# Patient Record
Sex: Male | Born: 1980 | Hispanic: No | Marital: Married | Smoking: Never smoker
Health system: Southern US, Community
[De-identification: ages and names within clinical notes are randomized; demographics above are authoritative.]

## PROBLEM LIST (undated history)

## (undated) DIAGNOSIS — A159 Respiratory tuberculosis unspecified: Secondary | ICD-10-CM

## (undated) DIAGNOSIS — H5440 Blindness, one eye, unspecified eye: Secondary | ICD-10-CM

## (undated) DIAGNOSIS — S0990XA Unspecified injury of head, initial encounter: Secondary | ICD-10-CM

## (undated) DIAGNOSIS — H409 Unspecified glaucoma: Secondary | ICD-10-CM

## (undated) HISTORY — DX: Unspecified glaucoma: H40.9

---

## 2013-09-24 ENCOUNTER — Ambulatory Visit
Admission: RE | Admit: 2013-09-24 | Discharge: 2013-09-24 | Disposition: A | Discharge status: Home / Self Care | Payer: No Typology Code available for payment source | Source: Ambulatory Visit | Attending: Infectious Disease | Admitting: Infectious Disease

## 2013-09-24 ENCOUNTER — Other Ambulatory Visit: Payer: Self-pay | Admitting: Infectious Disease

## 2013-09-24 DIAGNOSIS — A150 Tuberculosis of lung: Secondary | ICD-10-CM

## 2014-06-26 ENCOUNTER — Ambulatory Visit: Payer: Self-pay | Attending: Internal Medicine

## 2014-07-19 ENCOUNTER — Ambulatory Visit: Payer: Self-pay

## 2014-08-28 ENCOUNTER — Ambulatory Visit: Payer: Self-pay | Attending: Internal Medicine

## 2014-08-29 ENCOUNTER — Encounter (HOSPITAL_COMMUNITY): Payer: Self-pay

## 2014-08-29 ENCOUNTER — Emergency Department (INDEPENDENT_AMBULATORY_CARE_PROVIDER_SITE_OTHER)
Admission: EM | Admit: 2014-08-29 | Discharge: 2014-08-29 | Disposition: A | Discharge status: Home / Self Care | Payer: Self-pay | Source: Home / Self Care | Attending: Family Medicine | Admitting: Family Medicine

## 2014-08-29 DIAGNOSIS — R51 Headache: Secondary | ICD-10-CM

## 2014-08-29 DIAGNOSIS — Z87820 Personal history of traumatic brain injury: Secondary | ICD-10-CM

## 2014-08-29 DIAGNOSIS — R413 Other amnesia: Secondary | ICD-10-CM

## 2014-08-29 DIAGNOSIS — G8929 Other chronic pain: Secondary | ICD-10-CM

## 2014-08-29 HISTORY — DX: Respiratory tuberculosis unspecified: A15.9

## 2014-08-29 HISTORY — DX: Blindness, one eye, unspecified eye: H54.40

## 2014-08-29 HISTORY — DX: Unspecified injury of head, initial encounter: S09.90XA

## 2014-08-29 NOTE — Discharge Instructions (Signed)
Headaches, Frequently Asked Questions Will need referrals to neuro and opthalmologist for progressive chronic symptoms.    MIGRAINE HEADACHES Q: What is migraine? What causes it? How can I treat it? A: Generally, migraine headaches begin as a dull ache. Then they develop into a constant, throbbing, and pulsating pain. You may experience pain at the temples. You may experience pain at the front or back of one or both sides of the head. The pain is usually accompanied by a combination of:  Nausea.  Vomiting.  Sensitivity to light and noise. Some people (about 15%) experience an aura (see below) before an attack. The cause of migraine is believed to be chemical reactions in the brain. Treatment for migraine may include over-the-counter or prescription medications. It may also include self-help techniques. These include relaxation training and biofeedback.  Q: What is an aura? A: About 15% of people with migraine get an "aura". This is a sign of neurological symptoms that occur before a migraine headache. You may see wavy or jagged lines, dots, or flashing lights. You might experience tunnel vision or blind spots in one or both eyes. The aura can include visual or auditory hallucinations (something imagined). It may include disruptions in smell (such as strange odors), taste or touch. Other symptoms include:  Numbness.  A "pins and needles" sensation.  Difficulty in recalling or speaking the correct word. These neurological events may last as long as 60 minutes. These symptoms will fade as the headache begins. Q: What is a trigger? A: Certain physical or environmental factors can lead to or "trigger" a migraine. These include:  Foods.  Hormonal changes.  Weather.  Stress. It is important to remember that triggers are different for everyone. To help prevent migraine attacks, you need to figure out which triggers affect you. Keep a headache diary. This is a good way to track triggers. The  diary will help you talk to your healthcare professional about your condition. Q: Does weather affect migraines? A: Bright sunshine, hot, humid conditions, and drastic changes in barometric pressure may lead to, or "trigger," a migraine attack in some people. But studies have shown that weather does not act as a trigger for everyone with migraines. Q: What is the link between migraine and hormones? A: Hormones start and regulate many of your body's functions. Hormones keep your body in balance within a constantly changing environment. The levels of hormones in your body are unbalanced at times. Examples are during menstruation, pregnancy, or menopause. That can lead to a migraine attack. In fact, about three quarters of all women with migraine report that their attacks are related to the menstrual cycle.  Q: Is there an increased risk of stroke for migraine sufferers? A: The likelihood of a migraine attack causing a stroke is very remote. That is not to say that migraine sufferers cannot have a stroke associated with their migraines. In persons under age 55, the most common associated factor for stroke is migraine headache. But over the course of a person's normal life span, the occurrence of migraine headache may actually be associated with a reduced risk of dying from cerebrovascular disease due to stroke.  Q: What are acute medications for migraine? A: Acute medications are used to treat the pain of the headache after it has started. Examples over-the-counter medications, NSAIDs, ergots, and triptans.  Q: What are the triptans? A: Triptans are the newest class of abortive medications. They are specifically targeted to treat migraine. Triptans are vasoconstrictors. They moderate some chemical  reactions in the brain. The triptans work on receptors in your brain. Triptans help to restore the balance of a neurotransmitter called serotonin. Fluctuations in levels of serotonin are thought to be a main cause  of migraine.  Q: Are over-the-counter medications for migraine effective? A: Over-the-counter, or "OTC," medications may be effective in relieving mild to moderate pain and associated symptoms of migraine. But you should see your caregiver before beginning any treatment regimen for migraine.  Q: What are preventive medications for migraine? A: Preventive medications for migraine are sometimes referred to as "prophylactic" treatments. They are used to reduce the frequency, severity, and length of migraine attacks. Examples of preventive medications include antiepileptic medications, antidepressants, beta-blockers, calcium channel blockers, and NSAIDs (nonsteroidal anti-inflammatory drugs). Q: Why are anticonvulsants used to treat migraine? A: During the past few years, there has been an increased interest in antiepileptic drugs for the prevention of migraine. They are sometimes referred to as "anticonvulsants". Both epilepsy and migraine may be caused by similar reactions in the brain.  Q: Why are antidepressants used to treat migraine? A: Antidepressants are typically used to treat people with depression. They may reduce migraine frequency by regulating chemical levels, such as serotonin, in the brain.  Q: What alternative therapies are used to treat migraine? A: The term "alternative therapies" is often used to describe treatments considered outside the scope of conventional Western medicine. Examples of alternative therapy include acupuncture, acupressure, and yoga. Another common alternative treatment is herbal therapy. Some herbs are believed to relieve headache pain. Always discuss alternative therapies with your caregiver before proceeding. Some herbal products contain arsenic and other toxins. TENSION HEADACHES Q: What is a tension-type headache? What causes it? How can I treat it? A: Tension-type headaches occur randomly. They are often the result of temporary stress, anxiety, fatigue, or anger.  Symptoms include soreness in your temples, a tightening band-like sensation around your head (a "vice-like" ache). Symptoms can also include a pulling feeling, pressure sensations, and contracting head and neck muscles. The headache begins in your forehead, temples, or the back of your head and neck. Treatment for tension-type headache may include over-the-counter or prescription medications. Treatment may also include self-help techniques such as relaxation training and biofeedback. CLUSTER HEADACHES Q: What is a cluster headache? What causes it? How can I treat it? A: Cluster headache gets its name because the attacks come in groups. The pain arrives with little, if any, warning. It is usually on one side of the head. A tearing or bloodshot eye and a runny nose on the same side of the headache may also accompany the pain. Cluster headaches are believed to be caused by chemical reactions in the brain. They have been described as the most severe and intense of any headache type. Treatment for cluster headache includes prescription medication and oxygen. SINUS HEADACHES Q: What is a sinus headache? What causes it? How can I treat it? A: When a cavity in the bones of the face and skull (a sinus) becomes inflamed, the inflammation will cause localized pain. This condition is usually the result of an allergic reaction, a tumor, or an infection. If your headache is caused by a sinus blockage, such as an infection, you will probably have a fever. An x-ray will confirm a sinus blockage. Your caregiver's treatment might include antibiotics for the infection, as well as antihistamines or decongestants.  REBOUND HEADACHES Q: What is a rebound headache? What causes it? How can I treat it? A: A pattern of taking  acute headache medications too often can lead to a condition known as "rebound headache." A pattern of taking too much headache medication includes taking it more than 2 days per week or in excessive amounts.  That means more than the label or a caregiver advises. With rebound headaches, your medications not only stop relieving pain, they actually begin to cause headaches. Doctors treat rebound headache by tapering the medication that is being overused. Sometimes your caregiver will gradually substitute a different type of treatment or medication. Stopping may be a challenge. Regularly overusing a medication increases the potential for serious side effects. Consult a caregiver if you regularly use headache medications more than 2 days per week or more than the label advises. ADDITIONAL QUESTIONS AND ANSWERS Q: What is biofeedback? A: Biofeedback is a self-help treatment. Biofeedback uses special equipment to monitor your body's involuntary physical responses. Biofeedback monitors:  Breathing.  Pulse.  Heart rate.  Temperature.  Muscle tension.  Brain activity. Biofeedback helps you refine and perfect your relaxation exercises. You learn to control the physical responses that are related to stress. Once the technique has been mastered, you do not need the equipment any more. Q: Are headaches hereditary? A: Four out of five (80%) of people that suffer report a family history of migraine. Scientists are not sure if this is genetic or a family predisposition. Despite the uncertainty, a child has a 50% chance of having migraine if one parent suffers. The child has a 75% chance if both parents suffer.  Q: Can children get headaches? A: By the time they reach high school, most young people have experienced some type of headache. Many safe and effective approaches or medications can prevent a headache from occurring or stop it after it has begun.  Q: What type of doctor should I see to diagnose and treat my headache? A: Start with your primary caregiver. Discuss his or her experience and approach to headaches. Discuss methods of classification, diagnosis, and treatment. Your caregiver may decide to recommend  you to a headache specialist, depending upon your symptoms or other physical conditions. Having diabetes, allergies, etc., may require a more comprehensive and inclusive approach to your headache. The National Headache Foundation will provide, upon request, a list of [facility] physician members in your state. Document Released: 04/24/2003 Document Revised: 04/26/2011 Document Reviewed: 10/02/2007 [facility] Patient Information 2015 [place], [place]. This information is not intended to replace advice given to you by your health care provider. Make sure you discuss any questions you have with your health care provider.

## 2014-08-29 NOTE — ED Provider Notes (Signed)
CSN: [id number]     Arrival date & time 08/29/14  1433 History   First MD Initiated Contact with Patient 08/29/14 1551     Chief Complaint  Patient presents with  . Headache   (Consider location/radiation/quality/duration/timing/severity/associated sxs/prior Treatment) HPI Comments: 34 year old man from the [place] area is complaining of a right headache with increasing forgetfulness for one year. He states that he had a head injury when he was a small child and he also has a history of TB. A more recent chest x-ray last year showed no active processes. There are no acute symptoms today. He has spoken with Burman Nieves for assistance with orange card and follow-up with community wellness.   Past Medical History  Diagnosis Date  . Tuberculosis   . Head injury   . Blind left eye    History reviewed. No pertinent past surgical history. History reviewed. No pertinent family history. History  Substance Use Topics  . Smoking status: Never Smoker   . Smokeless tobacco: Not on file  . Alcohol Use: Not on file    Review of Systems  Constitutional: Positive for activity change. Negative for fever and fatigue.  Eyes: Positive for visual disturbance. Negative for pain and discharge.       Blind in the left eye. Chronic poor vision of the right eye with overlying opacity of the mid anterior chamber.  Respiratory: Negative.   Cardiovascular: Negative.   Gastrointestinal: Negative.   Skin: Negative for rash and wound.  Neurological: Positive for facial asymmetry and headaches. Negative for dizziness and syncope.  Psychiatric/Behavioral: Negative for behavioral problems.    Allergies  Review of patient's allergies indicates no known allergies.  Home Medications   Prior to Admission medications   Not on File   BP 108/80 mmHg  Pulse 78  Temp(Src) 98.4 F (36.9 C) (Oral)  SpO2 100% Physical Exam  Constitutional: He appears well-developed and well-nourished. No distress.  HENT:   Mouth/Throat: Oropharynx is clear and moist. No oropharyngeal exudate.  Eyes: Right eye exhibits no discharge. Left eye exhibits no discharge.  Right pupil is small and difficult to determine whether it is actually reacting. It is also partly hidden due to opacity over the anterior chamber. Complete blindness OS  Neck: Normal range of motion. Neck supple.  Cardiovascular: Normal rate, regular rhythm, normal heart sounds and intact distal pulses.   Pulmonary/Chest: Effort normal and breath sounds normal. No respiratory distress. He has no wheezes. He has no rales.  Abdominal: There is no tenderness.  Musculoskeletal: Normal range of motion. He exhibits no edema or tenderness.  Lymphadenopathy:    He has no cervical adenopathy.  Neurological: He is alert. He has normal strength. No cranial nerve deficit or sensory deficit. He exhibits normal muscle tone. He displays a negative Romberg sign. Coordination and gait normal.  Skin: Skin is warm and dry.  Psychiatric: He has a normal mood and affect. His behavior is normal.  Nursing note and vitals reviewed.   ED Course  Procedures (including critical care time) Labs Review Labs Reviewed - No data to display  Imaging Review No results found.   MDM   1. History of closed head injury   2. Chronic nonintractable headache, unspecified headache type   3. Memory impairment    Will need referrals to neuro and opthalmologist for progressive chronic symptoms.  Burman Nieves has started the [employer] and follow with [employer] for exam and referrals.  For worsening go to the ED No acute findings  today    Janne Napoleon, NP 08/29/14 [phone]

## 2014-08-29 NOTE — ED Notes (Signed)
C/o right sided HA x 1 year + . History of head injury as child. Reportedly on medication for TB , but has finished

## 2014-09-05 ENCOUNTER — Ambulatory Visit: Payer: Self-pay | Attending: Internal Medicine

## 2014-09-06 ENCOUNTER — Encounter: Payer: Self-pay | Admitting: Family Medicine

## 2014-09-06 ENCOUNTER — Ambulatory Visit: Payer: Self-pay | Attending: Family Medicine | Admitting: Family Medicine

## 2014-09-06 VITALS — BP 109/76 | HR 77 | Temp 98.3°F | Ht 68.0 in | Wt 177.0 lb

## 2014-09-06 DIAGNOSIS — R51 Headache: Secondary | ICD-10-CM

## 2014-09-06 DIAGNOSIS — R413 Other amnesia: Secondary | ICD-10-CM | POA: Insufficient documentation

## 2014-09-06 DIAGNOSIS — G44229 Chronic tension-type headache, not intractable: Secondary | ICD-10-CM | POA: Insufficient documentation

## 2014-09-06 DIAGNOSIS — H5440 Blindness, one eye, unspecified eye: Secondary | ICD-10-CM | POA: Insufficient documentation

## 2014-09-06 DIAGNOSIS — R519 Headache, unspecified: Secondary | ICD-10-CM | POA: Insufficient documentation

## 2014-09-06 DIAGNOSIS — S0990XA Unspecified injury of head, initial encounter: Secondary | ICD-10-CM | POA: Insufficient documentation

## 2014-09-06 DIAGNOSIS — H5442 Blindness, left eye, normal vision right eye: Secondary | ICD-10-CM | POA: Insufficient documentation

## 2014-09-06 HISTORY — DX: Blindness, one eye, unspecified eye: H54.40

## 2014-09-06 MED ORDER — BUTALBITAL-APAP-CAFFEINE 50-325-40 MG PO TABS: 1.0000 | ORAL_TABLET | Freq: Four times a day (QID) | ORAL | Status: DC | PRN

## 2014-09-06 NOTE — Progress Notes (Signed)
Patient ID: Adam Contreras, male   DOB: 01-14-1981, 34 y.o.   MRN: [id number]     Adam Contreras, is a 34 y.o. male  SAY:[id number]  ATF:[id number]  DOB - 1980/11/21  CC:  Chief Complaint  Patient presents with  . headaches       HPI: Adam Contreras is a 34 y.o. male from [place] here today to establish medical care. He is seen with the aid of an interpreter from language resources.  He complains of chronic headaches which are: Most days of the week in the frontal aspect of his head which he has had for the last 1 year and this has been associated with memory loss and deteriorating vision in his right eye. Headaches are throbbing. 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 States it feels like he has a film over his right eye which affects his vision. He endorses nausea but no vomiting and this is intermittent.  He was seen at [facility] urgent care for same and had a chest x-ray which showed no active disease. He has not had any brain imaging.       Marland KitchenNo Known Allergies Past Medical History  Diagnosis Date  . Tuberculosis   . Head injury   . Blind left eye    No current outpatient prescriptions on file prior to visit.   No current facility-administered medications on file prior to visit.   History reviewed. No pertinent family history. History   Social History  . Marital Status: Single    Spouse Name: N/A  . Number of Children: N/A  . Years of Education: N/A   Occupational History  . Not on file.   Social History Main Topics  . Smoking status: Never Smoker   . Smokeless tobacco: Not on file  . Alcohol Use: No  . Drug Use: No  . Sexual Activity: Not on file   Other Topics Concern  . Not on file   Social History  Narrative    Review of Systems: Constitutional: Negative for fever, chills, diaphoresis, activity change, appetite change and fatigue. HENT: Negative for ear pain, nosebleeds, congestion, facial swelling, rhinorrhea, neck pain, neck stiffness and ear discharge.  Eyes: Positive for left eye visual loss, deteriorating vision in the right eye. Respiratory: Negative for cough, choking, chest tightness, shortness of breath, wheezing and stridor.  Cardiovascular: Negative for chest pain, palpitations and leg swelling. Gastrointestinal: Negative for abdominal distention. Positive for nausea Genitourinary: Negative for dysuria, urgency, frequency, hematuria, flank pain, decreased urine volume, difficulty urinating and dyspareunia.  Musculoskeletal: Negative for back pain, joint swelling, arthralgia and gait problem. Neurological: Negative for dizziness, tremors, seizures, syncope, facial asymmetry, speech difficulty, weakness, light-headedness, numbness, positive for headaches. Hematological: Negative for adenopathy. Does not bruise/bleed easily. Skin: Negative for rash, ulcer. Psychiatric/Behavioral: Negative for hallucinations, behavioral problems, confusion, dysphoric mood, decreased concentration and agitation.    Objective:   Filed Vitals:   09/06/14 0919  BP: 109/76  Pulse: 77  Temp: 98.3 F (36.8 C)    Physical Exam: Constitutional: Patient appears well-developed and well-nourished. No distress. HENT: Normocephalic, atraumatic, External right and left ear normal. Oropharynx is clear and moist.  Eyes: Left prosthetic eye, visual acuity in right eye  is 20/200 Neck: Normal ROM, No JVD. No tracheal deviation. No thyromegaly. CVS: RRR, S1/S2 +, no murmurs, no gallops, no carotid bruit.  Pulmonary: Effort and breath sounds normal, no stridor, rhonchi, wheezes, rales.  Abdominal: Soft. BS +, no distension, tenderness, rebound or guarding.  Musculoskeletal: Normal range of motion. No edema  and no tenderness.  Lymphadenopathy: No lymphadenopathy noted, cervical, inguinal or axillary Neuro: Alert. Normal reflexes, muscle tone coordination. No cranial nerve deficit. Skin: Skin is warm and dry. No rash noted. Not diaphoretic. No erythema. No pallor. Psychiatric: Normal mood and affect. Behavior, judgment, thought content normal.     Assessment and plan:  34 year old male patient with a history of traumatic brain injury in the past, visual loss in the left eye presenting with chronic headaches, nausea, memory loss for one year.  Chronic headaches: Referred for CT scan of the head to exclude intracranial pathology. Placed on Fioricet to be used as needed.  Memory loss: This could be progressive from his traumatic brain injury. I am sending off a thyroid function, B12 and VDRL to further evaluate. He will need a neurology referral at his next visit.  Visual loss: Vision in the right eye is 20/200. Advised against driving. I am getting in touch with the referral coordinator to see what resources are available for ophthalmology given he has no insurance meanwhile I have advised him to walk into optometry services available at [facility], [facility], [facility] for preliminary evaluation.   The patient was given clear instructions to go to ER or return to medical center if symptoms don't improve, worsen or new problems develop. The patient verbalized understanding. The patient was told to call to get lab results if they haven't heard anything in the next week.     Arnoldo Morale, [place] [facility] [phone] 09/06/2014, 9:41 AM

## 2014-09-06 NOTE — Patient Instructions (Signed)

## 2014-09-06 NOTE — Progress Notes (Signed)
TBI as child Headaches for last year in frontal region Takes no medication and is in no pain today although reports headaches daily Orange card in Audiological scientist present

## 2014-09-07 LAB — TSH: TSH: 2.643 u[IU]/mL (ref 0.350–4.500)

## 2014-09-07 LAB — VITAMIN B12: Vitamin B-12: 510 pg/mL (ref 211–911)

## 2014-09-08 LAB — VDRL: VDRL, Serum: NONREACTIVE

## 2014-09-10 ENCOUNTER — Ambulatory Visit (HOSPITAL_COMMUNITY)
Admission: RE | Admit: 2014-09-10 | Discharge: 2014-09-10 | Disposition: A | Discharge status: Home / Self Care | Payer: Self-pay | Source: Ambulatory Visit | Attending: Family Medicine | Admitting: Family Medicine

## 2014-09-10 DIAGNOSIS — G930 Cerebral cysts: Secondary | ICD-10-CM | POA: Insufficient documentation

## 2014-09-10 DIAGNOSIS — S0990XA Unspecified injury of head, initial encounter: Secondary | ICD-10-CM

## 2014-09-10 DIAGNOSIS — D170 Benign lipomatous neoplasm of skin and subcutaneous tissue of head, face and neck: Secondary | ICD-10-CM | POA: Insufficient documentation

## 2014-09-10 DIAGNOSIS — Z87820 Personal history of traumatic brain injury: Secondary | ICD-10-CM | POA: Insufficient documentation

## 2014-09-10 DIAGNOSIS — G44229 Chronic tension-type headache, not intractable: Secondary | ICD-10-CM | POA: Insufficient documentation

## 2014-09-16 ENCOUNTER — Telehealth: Payer: Self-pay

## 2014-09-16 NOTE — Telephone Encounter (Signed)
Nurse called patient, via [employer], Interpreter [phone],  patient verified date of birth. Patient aware of normal labs. Patient voices understanding and has no further questions at this time.

## 2014-09-16 NOTE — Telephone Encounter (Signed)
-----   Message from Arnoldo Morale, MD sent at 09/09/2014 10:10 AM EDT ----- Please inform the patient that labs are normal. Thank you.

## 2014-09-17 ENCOUNTER — Other Ambulatory Visit: Payer: Self-pay | Admitting: Family Medicine

## 2014-09-17 DIAGNOSIS — R413 Other amnesia: Secondary | ICD-10-CM

## 2014-09-17 DIAGNOSIS — G44229 Chronic tension-type headache, not intractable: Secondary | ICD-10-CM

## 2014-09-17 NOTE — Telephone Encounter (Signed)
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

## 2014-09-17 NOTE — Telephone Encounter (Signed)
[facility] service does not have a [place] interpreter available at this time.  I asked when I should call back and he said later today.Marland KitchenMarland Kitchen

## 2014-09-20 ENCOUNTER — Encounter: Payer: Self-pay | Admitting: Family Medicine

## 2014-09-20 ENCOUNTER — Ambulatory Visit: Payer: Self-pay | Attending: Family Medicine | Admitting: Family Medicine

## 2014-09-20 VITALS — BP 107/73 | HR 68 | Temp 98.2°F | Ht 69.0 in | Wt 179.8 lb

## 2014-09-20 DIAGNOSIS — R930 Abnormal findings on diagnostic imaging of skull and head, not elsewhere classified: Secondary | ICD-10-CM

## 2014-09-20 DIAGNOSIS — R51 Headache: Secondary | ICD-10-CM | POA: Insufficient documentation

## 2014-09-20 DIAGNOSIS — H543 Unqualified visual loss, both eyes: Secondary | ICD-10-CM | POA: Insufficient documentation

## 2014-09-20 DIAGNOSIS — Z87820 Personal history of traumatic brain injury: Secondary | ICD-10-CM | POA: Insufficient documentation

## 2014-09-20 DIAGNOSIS — H5442 Blindness, left eye, normal vision right eye: Secondary | ICD-10-CM

## 2014-09-20 DIAGNOSIS — H5440 Blindness, one eye, unspecified eye: Secondary | ICD-10-CM

## 2014-09-20 DIAGNOSIS — G44229 Chronic tension-type headache, not intractable: Secondary | ICD-10-CM

## 2014-09-20 DIAGNOSIS — R413 Other amnesia: Secondary | ICD-10-CM | POA: Insufficient documentation

## 2014-09-20 NOTE — Progress Notes (Signed)
Subjective:    Patient ID: Adam Contreras, male    DOB: [birth year], 34 y.o.   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 He is headaches are intermittent and occur about 2-3 times a week with no nausea, no vomiting, no seizure-like activity.  He is seen with the aid of an interpreter from language [facility] today.  Past Medical History  Diagnosis Date  . Tuberculosis   . Head injury   . Blind left eye     History reviewed. No pertinent past surgical history.  History   Social History  . Marital Status: Single    Spouse Name: N/A  . Number of Children: N/A  . Years of Education: N/A   Occupational History  . Not on file.   Social History Main Topics  . Smoking status: Never Smoker   . Smokeless tobacco: Not on file  . Alcohol Use: No  . Drug Use: No  . Sexual Activity: Not on file   Other Topics Concern  . Not on file   Social History Narrative   Scheduled Meds: Continuous Infusions: PRN Meds:.    Review of Systems Constitutional: Negative for fever, chills, diaphoresis, activity change, appetite change and fatigue. HENT: Negative for ear pain, nosebleeds, congestion, facial swelling, rhinorrhea, neck pain, neck stiffness and ear discharge.  Eyes: Positive for left eye visual loss, deteriorating vision in the right eye. Respiratory: Negative for cough, choking, chest tightness, shortness of breath,  wheezing and stridor.  Cardiovascular: Negative for chest pain, palpitations and leg swelling. Gastrointestinal: Negative for abdominal distention. Positive for nausea Genitourinary: Negative for dysuria, urgency, frequency, hematuria, flank pain, decreased urine volume, difficulty urinating and dyspareunia.  Musculoskeletal: Negative for back pain, joint swelling, arthralgia and gait problem. Neurological: Negative for dizziness, tremors, seizures, syncope, facial asymmetry, speech difficulty, weakness, light-headedness, numbness, positive for headaches. Hematological: Negative for adenopathy. Does not bruise/bleed easily. Skin: Negative for rash, ulcer. Psychiatric/Behavioral: Negative for hallucinations, behavioral problems, confusion, dysphoric mood, decreased concentration and agitation.      Objective:  Filed Vitals:   09/20/14 0934  BP: 107/73  Pulse: 68  Temp: 98.2 F (36.8 C)  Height: 5\' 9"  (1.753 m)  Weight: 179 lb 12.8 oz (81.557 kg)  SpO2: 97%      Physical Exam  Constitutional: Patient appears well-developed and well-nourished. No distress. HENT: Normocephalic, atraumatic, External right and left ear normal. Oropharynx is clear and moist.  Eyes: Left prosthetic eye, visual acuity in right eye is 20/200 Neck: Normal ROM, No JVD. No tracheal deviation. No thyromegaly. CVS: RRR, S1/S2 +, no murmurs, no gallops, no carotid bruit.  Pulmonary: Effort and breath sounds normal, no stridor, rhonchi, wheezes, rales.  Abdominal: Soft. BS +, no distension, tenderness, rebound or guarding.  Musculoskeletal: Normal range of motion. No edema and no tenderness.  Lymphadenopathy: No lymphadenopathy noted, cervical, inguinal or axillary Neuro: Alert. Normal  Assessment & Plan:  34 year old male patient with a history of traumatic brain injury in the past, visual loss in the left eye presenting with chronic headaches, nausea, memory loss for one year.  Chronic  headaches: Continue  Fioricet to be used as needed.  Keep appointment with neurology especially in the setting of abnormal head CT.  Memory loss: Thyroid function, B12 and VDRL  all normal.  Visual loss: Vision in the right eye is 20/200. Advised against driving. He will need to keep appointment with services.   The patient was given clear instructions to go to ER or return to medical center if symptoms don't improve, worsen or new problems develop. The patient verbalized understanding. The patient was told to call to get lab results if they haven't heard anything in the next week.

## 2014-09-20 NOTE — Patient Instructions (Signed)

## 2014-09-20 NOTE — Progress Notes (Signed)
Patient here with interpreter from [place] He reports his headaches are no better and he is taking his Fioricet

## 2014-10-15 ENCOUNTER — Ambulatory Visit: Payer: MEDICAID | Attending: Family Medicine | Admitting: Family Medicine

## 2014-10-15 ENCOUNTER — Encounter: Payer: Self-pay | Admitting: Family Medicine

## 2014-10-15 VITALS — BP 108/73 | HR 85 | Temp 97.8°F | Resp 16 | Ht 68.0 in | Wt 180.0 lb

## 2014-10-15 DIAGNOSIS — R51 Headache: Secondary | ICD-10-CM | POA: Insufficient documentation

## 2014-10-15 DIAGNOSIS — H547 Unspecified visual loss: Secondary | ICD-10-CM | POA: Insufficient documentation

## 2014-10-15 DIAGNOSIS — G44229 Chronic tension-type headache, not intractable: Secondary | ICD-10-CM
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
## 2014-10-15 NOTE — Assessment & Plan Note (Signed)
A: slight improvement in headache P:  Keep neurology appt for abnormal CT head Continue fioricet prn

## 2014-10-15 NOTE — Progress Notes (Signed)
   Subjective:    Patient ID: Adam Contreras, male    DOB: 08-20-1980, 34 y.o.   MRN: [id number] CC: establis care, chronic headache  HPI 34 yo M presents for f/u visit  1. Chronic headaches: pain is currently 5/10. Has disorientation when headache is severe. Denies fever, chills, nausea, emesis or vision change in R eye. 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   2. Poor vision R eye: has been evaluated for cataract surgery but currently does not have post-op care available, so surgery has been delayed.   Social History  Substance Use Topics  . Smoking status: Never Smoker   . Smokeless tobacco: Not on file  . Alcohol Use: No   Review of Systems  Constitutional: Negative for fever, chills, fatigue and unexpected weight change.  Eyes: Negative for visual disturbance.  Respiratory: Negative for cough and shortness of breath.   Cardiovascular: Negative for chest pain, palpitations and leg swelling.  Gastrointestinal: Negative for nausea, vomiting, abdominal pain, diarrhea, constipation and blood in stool.  Endocrine: Negative for polydipsia, polyphagia and polyuria.  Musculoskeletal: Negative for myalgias, back pain, arthralgias, gait problem and neck pain.  Skin: Negative for rash.  Allergic/Immunologic: Negative for immunocompromised state.  Neurological: Positive for headaches.  Hematological: Negative for adenopathy. Does not bruise/bleed easily.  Psychiatric/Behavioral: Positive for confusion. Negative for suicidal ideas, sleep disturbance and dysphoric mood. The patient is not nervous/anxious.       Objective:   Physical Exam  Constitutional: He appears well-developed and well-nourished. No distress.  HENT:  Head: Normocephalic and atraumatic.    Eyes: Conjunctivae, EOM and lids are normal. Pupils are equal, round, and  reactive to light.    Neck: Normal range of motion. Neck supple.  Pulmonary/Chest: Effort normal.  Musculoskeletal: He exhibits no edema.  Neurological: He is alert. He has normal strength. No sensory deficit.  Skin: Skin is warm and dry. No rash noted. No erythema.  Psychiatric: He has a normal mood and affect.          Assessment & Plan:

## 2014-10-15 NOTE — Patient Instructions (Signed)
Mr. Duchemin,  Thank you for coming in today Improved pain Stable exam  Continue fiorcet as needed Neurology evaluation pending  F/u in September with flu clinic for flu shot F/u with me in 2 months for chronic headaches  Dr. Adrian Blackwater

## 2014-11-12 ENCOUNTER — Ambulatory Visit (INDEPENDENT_AMBULATORY_CARE_PROVIDER_SITE_OTHER): Payer: Self-pay | Admitting: Neurology

## 2014-11-12 ENCOUNTER — Encounter: Payer: Self-pay | Admitting: Neurology

## 2014-11-12 VITALS — BP 110/72 | HR 77 | Resp 16 | Wt 178.0 lb

## 2014-11-12 DIAGNOSIS — R413 Other amnesia: Secondary | ICD-10-CM

## 2014-11-12 DIAGNOSIS — G44201 Tension-type headache, unspecified, intractable: Secondary | ICD-10-CM

## 2014-11-12 MED ORDER — NORTRIPTYLINE HCL 25 MG PO CAPS: 25.0000 mg | ORAL_CAPSULE | Freq: Every day | ORAL | Status: DC

## 2014-11-12 NOTE — Progress Notes (Signed)
NEUROLOGY CONSULTATION NOTE  Adam Contreras MRN: [id number] DOB: 19-May-1980  Referring provider: Dr. Boykin Nearing Primary care provider: Dr. Boykin Nearing  Reason for consult:  Headaches, memory loss, abnormal head CT  Dear Dr Adrian Blackwater:  Thank you for your kind referral of [facility] for consultation of the above symptoms. Although his history is well known to you, please allow me to reiterate it for the purpose of our medical record. Tigrinian translation was provided by the language line [phone]. Records and images were personally reviewed where available.  HISTORY OF PRESENT ILLNESS: This is a 34 year old left-handed man with a history of head injury at age 53, who immigrated to [name] in 2014. He currently lives with a roommate. He is able to speak some [place]. He reports that he was doing well until 1 year ago, when she started to forget "everything." He would forget where he put things, appointments, or conversations. He has been unable to work because he would forget instructions. He has not been working since October. He wakes up sometimes and would not know where the doors are. His roommate would ask why he put something in one place, and he would not recall doing it. He would be in the middle of thinking of something, then has to recall what he was thinking about. His roommate has told him he repeats himself. He does not drive. He has also been having headaches over the past year, occurring 3-4 times a week, lasting for several hours, over the region where he hit his head as a child. He has some nausea, rarely vomiting associated with these. No dizziness, diplopia, dysarthria/dysphagia, neck/back pain, bowel/bladder dysfunction. He has occasional left hand numbness and pain in his wrist. He has poor sleep, sometimes he only sleeps 3 hours, other times he sleeps "too much." He reports mood is okay. He injured his left eye when very young and is blind on this eye. He has had  right eye problems since age 34, which are unchanged. There is no family history of memory changes or headaches.   I personally reviewed head CT without contrast done 09/10/14 which showed a 6.6 x 2.2 hypodensity in the interhemispheric fissure anteriorly with associated peripheral linear calcifications, felt to be consistent with an interhemispheric fissure lipoma. There was a small left posterior fossa arachnoid cyst.  Laboratory Data:  Lab Results  Component Value Date   TSH 2.643 09/06/2014   Lab Results  Component Value Date   MMNOTRRN16 579 09/06/2014   VDRL nonreactive  PAST MEDICAL HISTORY: Past Medical History  Diagnosis Date  . Tuberculosis   . Head injury   . Blind left eye     PAST SURGICAL HISTORY: No past surgical history on file.  MEDICATIONS: No current outpatient prescriptions on file prior to visit.   No current facility-administered medications on file prior to visit.    ALLERGIES: No Known Allergies  FAMILY HISTORY: Family History  Problem Relation Age of Onset  . Family history unknown: Yes    SOCIAL HISTORY: Social History   Social History  . Marital Status: Single    Spouse Name: N/A  . Number of Children: N/A  . Years of Education: N/A   Occupational History  . Not on file.   Social History Main Topics  . Smoking status: Never Smoker   . Smokeless tobacco: Not on file  . Alcohol Use: No  . Drug Use: No  . Sexual Activity: Not on file   Other  Topics Concern  . Not on file   Social History Narrative    REVIEW OF SYSTEMS: Constitutional: No fevers, chills, or sweats, no generalized fatigue, change in appetite Eyes: as above Ear, nose and throat: No hearing loss, ear pain, nasal congestion, sore throat Cardiovascular: No chest pain, palpitations Respiratory:  No shortness of breath at rest or with exertion, wheezes GastrointestinaI: No nausea, vomiting, diarrhea, abdominal pain, fecal incontinence Genitourinary:  No dysuria,  urinary retention or frequency Musculoskeletal:  No neck pain, back pain Integumentary: No rash, pruritus, skin lesions Neurological: as above Psychiatric: No depression, insomnia, anxiety Endocrine: No palpitations, fatigue, diaphoresis, mood swings, change in appetite, change in weight, increased thirst Hematologic/Lymphatic:  No anemia, purpura, petechiae. Allergic/Immunologic: no itchy/runny eyes, nasal congestion, recent allergic reactions, rashes  PHYSICAL EXAM: Filed Vitals:   11/12/14 1026  BP: 110/72  Pulse: 77  Resp: 16   General: No acute distress Head:  Normocephalic, +indentation on the vertex. Left phthisis bulbi Eyes: Fundoscopic exam shows sharp disc on right, no vessel changes, exudates, or hemorrhages.  Neck: supple, no paraspinal tenderness, full range of motion Back: No paraspinal tenderness Heart: regular rate and rhythm Lungs: Clear to auscultation bilaterally. Vascular: No carotid bruits. Skin/Extremities: No rash, no edema Neurological Exam: Mental status: alert and oriented to person, place, and time, no dysarthria or aphasia, Fund of knowledge is appropriate.  Recent and remote memory are intact.  Attention and concentration are normal.    Able to name objects and repeat phrases.  MMSE - Mini Mental State Exam 11/12/2014  Orientation to time 4  Orientation to Place 5  Registration 3  Attention/ Calculation 5  Recall 3  Language- name 2 objects 2  Language- repeat 1  Language- follow 3 step command 3  Language- read & follow direction 1  Write a sentence 1  Copy design 1  Total score 29   Cranial nerves: CN I: not tested CN II: phthisis bulbi on left with unreactive pupil, OD pupil round reactive to light, visual fields intact, fundi unremarkable. CN III, IV, VI:  full range of motion on right, limited movements of left eye, no nystagmus, no ptosis CN V: facial sensation intact CN VII: upper and lower face symmetric CN VIII: hearing intact to  finger rub CN IX, X: gag intact, uvula midline CN XI: sternocleidomastoid and trapezius muscles intact CN XII: tongue midline Bulk & Tone: normal, no fasciculations. Motor: 5/5 throughout with no pronator drift. Sensation: intact to light touch, cold, pin, vibration and joint position sense.  No extinction to double simultaneous stimulation.  Romberg test negative Deep Tendon Reflexes: +2 throughout, no ankle clonus Plantar responses: downgoing bilaterally Cerebellar: no incoordination on finger to nose, heel to shin. No dysdiadochokinesia Gait: narrow-based and steady, able to tandem walk adequately. Tremor: none  IMPRESSION: This is a 34 year old left-handed man with a history of head injury at age 44 presenting for evaluation of worsening memory and headaches over the past year. His neurological exam is non-focal, MMSE today normal 29/30. His head CT shows a large hypodensity in the interhemispheric fissure anteriorly that appears more chronic, unlikely the cause of his current symptoms. The etiology of his memory loss is unclear, he reports mood is good. The brain abnormality may increase risk for seizures, an EEG will be ordered to assess for subclinical seizures. Headaches suggestive of tension-type headaches, he may benefit from starting a daily headache prophylactic medication that could help with sleep, poor sleep may be contributing to  cognitive changes as well. He will start nortriptyline 25mg  qhs. He would ultimately benefit from an MRI brain with and without contrast to further delineate the brain abnormality, as well as Neurocognitive testing, however these are cost-prohibitive at this time. He will follow-up in 3 months.   Thank you for allowing me to participate in the care of this patient. Please do not hesitate to call for any questions or concerns.   Ellouise Newer, M.D.  CC: Dr. Adrian Blackwater

## 2014-11-12 NOTE — Patient Instructions (Signed)
1. Schedule routine EEG 2. Start nortriptyline 25mg : take 1 capsule daily at bedtime 3. Follow-up in 3 months, call for any problems

## 2014-11-13 ENCOUNTER — Ambulatory Visit (INDEPENDENT_AMBULATORY_CARE_PROVIDER_SITE_OTHER): Payer: Self-pay | Admitting: Neurology

## 2014-11-13 DIAGNOSIS — R413 Other amnesia: Secondary | ICD-10-CM

## 2014-11-13 DIAGNOSIS — G44201 Tension-type headache, unspecified, intractable: Secondary | ICD-10-CM

## 2014-11-14 ENCOUNTER — Encounter: Payer: Self-pay | Admitting: Neurology

## 2014-11-20 ENCOUNTER — Telehealth: Payer: Self-pay | Admitting: Family Medicine

## 2014-11-20 NOTE — Procedures (Signed)
ELECTROENCEPHALOGRAM REPORT  Date of Study: 11/13/2014  Patient's Name: Adam Contreras MRN: [id number] Date of Birth: 1980-08-26  Referring Provider: Dr. Ellouise Newer  Clinical History: This is a 35 year old man with a history of head injury, head CT shows large hypodensity in the interhemispheric fissure anteriorly, reporting memory loss. EEG to assess for subclinical seizures.  Medications: nortriptyline  Technical Summary: A multichannel digital EEG recording measured by the international 10-20 system with electrodes applied with paste and impedances below 5000 ohms performed in our laboratory with EKG monitoring in an awake and asleep patient.  Hyperventilation and photic stimulation were performed.  The digital EEG was referentially recorded, reformatted, and digitally filtered in a variety of bipolar and referential montages for optimal display.    Description: The patient is awake and asleep during the recording.  During maximal wakefulness, there is a symmetric, medium voltage 10 Hz posterior dominant rhythm that attenuates with eye opening.  The record is symmetric.  During drowsiness and stage I sleep, there is an increase in theta slowing of the background and occasional vertex waves were seen.  Hyperventilation and photic stimulation did not elicit any abnormalities.  There were no epileptiform discharges or electrographic seizures seen.    EKG lead was unremarkable.  Impression: This awake and asleep EEG is normal.    Clinical Correlation: A normal EEG does not exclude a clinical diagnosis of epilepsy.  If further clinical questions remain, prolonged EEG may be helpful.  Clinical correlation is advised.   Ellouise Newer, M.D.

## 2014-11-20 NOTE — Telephone Encounter (Signed)
Patient was notified of result. 

## 2014-11-20 NOTE — Telephone Encounter (Signed)
-----   Message from Cameron Sprang, MD sent at 11/20/2014 12:21 PM EDT ----- Pls let him know brain wave test is normal, thanks

## 2014-12-11 ENCOUNTER — Ambulatory Visit: Payer: MEDICAID | Attending: Family Medicine

## 2015-02-14 ENCOUNTER — Ambulatory Visit: Payer: Self-pay | Admitting: Neurology

## 2015-05-07 ENCOUNTER — Encounter: Payer: Self-pay | Admitting: *Deleted

## 2015-05-07 DIAGNOSIS — Z139 Encounter for screening, unspecified: Secondary | ICD-10-CM

## 2015-05-14 ENCOUNTER — Ambulatory Visit: Payer: Self-pay | Attending: Family Medicine

## 2015-05-29 ENCOUNTER — Ambulatory Visit: Payer: Self-pay | Attending: Family Medicine

## 2015-05-29 MED FILL — BUTALBITAL/APAP/CAFFEINE TA: 50-325-40 | 7 days supply | Qty: 60 | Fill #1

## 2018-07-20 ENCOUNTER — Encounter (HOSPITAL_COMMUNITY): Payer: Self-pay | Admitting: Emergency Medicine

## 2018-07-20 ENCOUNTER — Ambulatory Visit (INDEPENDENT_AMBULATORY_CARE_PROVIDER_SITE_OTHER): Payer: Self-pay

## 2018-07-20 ENCOUNTER — Other Ambulatory Visit: Payer: Self-pay

## 2018-07-20 ENCOUNTER — Ambulatory Visit (HOSPITAL_COMMUNITY)
Admission: EM | Admit: 2018-07-20 | Discharge: 2018-07-20 | Disposition: A | Discharge status: Home / Self Care | Payer: Self-pay | Attending: Family Medicine | Admitting: Family Medicine

## 2018-07-20 DIAGNOSIS — M7918 Myalgia, other site: Secondary | ICD-10-CM

## 2018-07-20 MED ORDER — IBUPROFEN 800 MG PO TABS: 800.0000 mg | ORAL_TABLET | Freq: Three times a day (TID) | ORAL | 0 refills | Status: DC
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
## 2018-07-20 NOTE — Discharge Instructions (Addendum)
Your x ray was normal You can take ibuprofen as needed for pain. Sent that to the pharmacy.  Heat gentle stretching and massage can help.
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
## 2018-09-05 ENCOUNTER — Other Ambulatory Visit: Payer: Self-pay

## 2018-09-05 ENCOUNTER — Ambulatory Visit (HOSPITAL_COMMUNITY)
Admission: EM | Admit: 2018-09-05 | Discharge: 2018-09-05 | Disposition: A | Discharge status: Home / Self Care | Payer: MEDICAID | Attending: Family Medicine | Admitting: Family Medicine

## 2018-09-05 NOTE — ED Notes (Signed)
Tiffany, rad tech, heard that patient was not expecting to pay.  Patient left without communication with clinical staff

## 2019-09-10 ENCOUNTER — Encounter (HOSPITAL_COMMUNITY): Payer: Self-pay | Admitting: Emergency Medicine

## 2019-09-10 ENCOUNTER — Other Ambulatory Visit: Payer: Self-pay

## 2019-09-10 ENCOUNTER — Ambulatory Visit (HOSPITAL_COMMUNITY)
Admission: EM | Admit: 2019-09-10 | Discharge: 2019-09-10 | Disposition: A | Discharge status: Home / Self Care | Payer: Self-pay | Attending: Family Medicine | Admitting: Family Medicine

## 2019-09-10 DIAGNOSIS — G8929 Other chronic pain: Secondary | ICD-10-CM

## 2019-09-10 DIAGNOSIS — K649 Unspecified hemorrhoids: Secondary | ICD-10-CM

## 2019-09-10 DIAGNOSIS — R519 Headache, unspecified: Secondary | ICD-10-CM

## 2019-09-10 DIAGNOSIS — T859XXA Unspecified complication of internal prosthetic device, implant and graft, initial encounter: Secondary | ICD-10-CM

## 2019-09-10 MED ORDER — LIDOCAINE 5 % EX OINT: 1.0000 "application " | TOPICAL_OINTMENT | CUTANEOUS | 0 refills | Status: DC | PRN

## 2019-09-10 MED ORDER — ERYTHROMYCIN 5 MG/GM OP OINT: TOPICAL_OINTMENT | OPHTHALMIC | 0 refills | Status: AC

## 2019-09-10 MED ORDER — HYDROCORTISONE (PERIANAL) 2.5 % EX CREA: 1.0000 "application " | TOPICAL_CREAM | Freq: Two times a day (BID) | CUTANEOUS | 0 refills | Status: DC

## 2019-09-10 MED FILL — ERYTHROMYCIN EYE OINTMENT: 5 | 7 days supply | Qty: 4 | Fill #0

## 2019-09-10 MED FILL — LIDOCAINE 5 % OINT: 5 | 7 days supply | Qty: 35 | Fill #0

## 2019-09-10 MED FILL — PROCTOZONE-HC 2.5 % CREA: 2.5 | 7 days supply | Qty: 30 | Fill #0
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
## 2019-09-10 NOTE — ED Provider Notes (Signed)
[facility]    CSN: [id number] Arrival date & time: 09/10/19  1248      History   Chief Complaint Chief Complaint  Patient presents with  . Headache    HPI Ac Colan is a 39 y.o. male.   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  He also endorses that he has chronic headaches on and off.  States that these are as they usually are for him.  Denies changes in duration or frequency.  States he is not having any headache today.  Denies numbness, tingling weakness.  Denies issues with gait or dizziness.  He reports he has had prosthetic eyes since he was a child and injury to the eye.  He reports he has had a little bit of discharge from behind the glass eye on and off for the last year.  He reports some irritation required to take it out and change it every couple of hours.  This is not necessarily increased recently.  Denies significant pain in the eye.  No pain around the eye.  He also reports and states that this is his primary concern, for some time he has been having some rectal pain with bowel movements and believes he has a hemorrhoid.  He is worried he might have an infection.  He states that he feels that if he gets the " hemorrhoids fixed his other problems will go away."  He has not had any blood on the stool or on the paper.  He does not have a primary care.  He states he did try to get a eye doctor appointment but has to wait till September.     Past Medical History:  Diagnosis Date  . Blind left eye   . Head injury   . Tuberculosis     Patient Active Problem List   Diagnosis Date Noted  . Abnormal head CT 09/20/2014  . Head injury 09/06/2014  . Blind left eye 09/06/2014  . Headache 09/06/2014  . Memory loss 09/06/2014    History reviewed. No pertinent surgical history.     Home Medications    Prior to  Admission medications   Medication Sig Start Date End Date Taking? Authorizing Provider  erythromycin ophthalmic ointment Place a 1/2 inch ribbon of ointment into the lower eyelid. 09/10/19 09/14/19  Darien Kading, Marguerita Beards, PA-C  hydrocortisone (ANUSOL-HC) 2.5 % rectal cream Place 1 application rectally 2 (two) times daily. 09/10/19   Kaydn Kumpf, Marguerita Beards, PA-C  ibuprofen (ADVIL) 800 MG tablet Take 1 tablet (800 mg total) by mouth 3 (three) times daily. 07/20/18   Bast, Tressia Miners A, NP  lidocaine (XYLOCAINE) 5 % ointment Apply 1 application topically as needed. 09/10/19   Wendelyn Kiesling, Marguerita Beards, PA-C  nortriptyline (PAMELOR) 25 MG capsule Take 1 capsule (25 mg total) by mouth at bedtime. 11/12/14   Cameron Sprang, MD    Family History Family History  Family history unknown: Yes    Social History Social History   Tobacco Use  . Smoking status: Never Smoker  Substance Use Topics  . Alcohol use: No    Alcohol/week: 0.0 standard drinks  . Drug use: No     Allergies   Patient has no known allergies.   Review of Systems Review of Systems   Physical Exam Triage Vital Signs ED Triage Vitals  Enc Vitals Group  BP 09/10/19 1404 116/69     Pulse Rate 09/10/19 1404 73     Resp 09/10/19 1404 18     Temp 09/10/19 1404 98.2 F (36.8 C)     Temp Source 09/10/19 1404 Oral     SpO2 09/10/19 1404 97 %     Weight --      Height --      Head Circumference --      Peak Flow --      Pain Score 09/10/19 1401 6     Pain Loc --      Pain Edu? --      Excl. in [place]? --    No data found.  Updated Vital Signs BP 116/69 (BP Location: Right Arm)   Pulse 73   Temp 98.2 F (36.8 C) (Oral)   Resp 18   SpO2 97%   Visual Acuity Right Eye Distance:   Left Eye Distance:   Bilateral Distance:    Right Eye Near:   Left Eye Near:    Bilateral Near:     Physical Exam Vitals and nursing note reviewed.  Constitutional:      General: He is not in acute distress.    Appearance: He is well-developed. He is not  ill-appearing.  HENT:     Head: Normocephalic and atraumatic.  Eyes:     Conjunctiva/sclera: Conjunctivae normal.     Comments: Left eye with prosthetic glass eye.  There is scant discharge behind the eye.  There is a retained globe of natural eye.  No erythema.  No tenderness around the orbit.  Extraocular movements are intact without pain.  Right eye with PERRL and good movements  Cardiovascular:     Rate and Rhythm: Normal rate and regular rhythm.     Heart sounds: No murmur heard.   Pulmonary:     Effort: Pulmonary effort is normal. No respiratory distress.     Breath sounds: Normal breath sounds.  Abdominal:     Palpations: Abdomen is soft.     Tenderness: There is no abdominal tenderness.  Genitourinary:    Comments: 2 external hemorrhoids at the 12 o'clock position.  Mildly tender.  No thrombosis.  No evidence of fissure. Musculoskeletal:     Cervical back: Neck supple.  Skin:    General: Skin is warm and dry.  Neurological:     Mental Status: He is alert and oriented to person, place, and time.     GCS: GCS eye subscore is 4. GCS verbal subscore is 5. GCS motor subscore is 6.     Cranial Nerves: No cranial nerve deficit, dysarthria or facial asymmetry.     Sensory: No sensory deficit.     Motor: No weakness.     Coordination: Coordination normal.      UC Treatments / Results  Labs (all labs ordered are listed, but only abnormal results are displayed) Labs Reviewed - No data to display  EKG   Radiology No results found.  Procedures Procedures (including critical care time)  Medications Ordered in UC Medications - No data to display  Initial Impression / Assessment and Plan / UC Course  I have reviewed the triage vital signs and the nursing notes.  Pertinent labs & imaging results that were available during my care of the patient were reviewed by me and considered in my medical decision making (see chart for details).     #Complication prosthetic  eye #Hemorrhoids #Chronic headaches Patient is a 39 year old with history of  traumatic brain injury and prostatic eye presenting with irritation of the left prostatic eye and hemorrhoids.  Given some drainage on history will give erythromycin ointment to place behind the glass eye when he places back in the orbit.  Instructed patient to follow-up with on-call ophthalmology for sooner follow-up, number given instructed to call following discharge for first thing tomorrow for appointment as soon as possible.  Neurologically intact today without acute change in baseline headaches.  We will have him follow-up with internal medicine center to establish care as is been sometime since his primary care.  Will use topical treatments for the hemorrhoids and sitz bath.  Strict emergency department and return precautions were discussed.  Patient verbalized understanding the plan of care. Final Clinical Impressions(s) / UC Diagnoses   Final diagnoses:  Chronic nonintractable headache, unspecified headache type  Complication of prosthetic eye, initial encounter  Hemorrhoids, unspecified hemorrhoid type     Discharge Instructions     Mix the anusol cream and lidocaine ointment together and apply to your rectum 2 times a day Do the sitz baths as shown in instructions  Apply the erythrmoycin ointment to back of prosthetic eye daily in the morning - call  the Eye doctor attached, you should have sooner follow up than September  Call internal medicine center to establish with a Primary care as soon as possible  If severe headache, weakness, numbness, severe dizziness go to the emergency department     ED Prescriptions    Medication Sig Dispense Auth. Provider   erythromycin ophthalmic ointment Place a 1/2 inch ribbon of ointment into the lower eyelid. 3.5 g Dmarius Reeder, Marguerita Beards, PA-C   hydrocortisone (ANUSOL-HC) 2.5 % rectal cream Place 1 application rectally 2 (two) times daily. 30 g Jaeley Wiker, Marguerita Beards, PA-C    lidocaine (XYLOCAINE) 5 % ointment Apply 1 application topically as needed. 35.44 g Arijana Narayan, Marguerita Beards, PA-C     PDMP not reviewed this encounter.   Purnell Shoemaker, PA-C 09/10/19 1617

## 2019-09-10 NOTE — Discharge Instructions (Signed)
Mix the anusol cream and lidocaine ointment together and apply to your rectum 2 times a day Do the sitz baths as shown in instructions  Apply the erythrmoycin ointment to back of prosthetic eye daily in the morning - call  the Eye doctor attached, you should have sooner follow up than September  Call internal medicine center to establish with a Primary care as soon as possible  If severe headache, weakness, numbness, severe dizziness go to the emergency department
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
## 2022-01-15 DIAGNOSIS — Z419 Encounter for procedure for purposes other than remedying health state, unspecified: Secondary | ICD-10-CM | POA: Diagnosis not present

## 2022-02-15 DIAGNOSIS — Z419 Encounter for procedure for purposes other than remedying health state, unspecified: Secondary | ICD-10-CM | POA: Diagnosis not present

## 2022-08-06 ENCOUNTER — Ambulatory Visit: Payer: Self-pay | Admitting: Sports Medicine

## 2022-08-11 ENCOUNTER — Ambulatory Visit: Payer: Self-pay

## 2022-08-11 NOTE — Telephone Encounter (Signed)
  Pt's friend Alfio Loescher called to report that the patient is having eye issues and cannot see. Scheduled for October 24 new patient appt.     Chief Complaint: Pt.'s friend asking for sooner appointment to establish care. Given [facility] numbers. Pt. Has vision issues "for years and since childhood." Symptoms: Poor vision Frequency: n/a Pertinent Negatives: Patient denies  Disposition: [] ED /[] Urgent Care (no appt availability in office) / [] Appointment(In office/virtual)/ []  [facility]/ [] Home Care/ [] Refused Recommended Disposition /[] [facility]/ [x]  Follow-up with PCP Additional Notes:   Reason for Disposition  [1] Blurred vision or visual changes AND [2] gradual onset (e.g., weeks, months)  Answer Assessment - Initial Assessment Questions 1. DESCRIPTION: "How has your vision changed?" (e.g., complete vision loss, blurred vision, double vision, floaters, etc.)     Cannot see out of left eye from childhood.  2. LOCATION: "One or both eyes?" If one, ask: "Which eye?"     Both 3. SEVERITY: "Can you see anything?" If Yes, ask: "What can you see?" (e.g., fine print)     Poor vision 4. ONSET: "When did this begin?" "Did it start suddenly or has this been gradual?"     Years  5. PATTERN: "Does this come and go, or has it been constant since it started?"     Constant 6. PAIN: "Is there any pain in your eye(s)?"  (Scale 1-10; or mild, moderate, severe)   - NONE (0): No pain.   - MILD (1-3): Doesn't interfere with normal activities.   - MODERATE (4-7): Interferes with normal activities or awakens from sleep.    - SEVERE (8-10): Excruciating pain, unable to do any normal activities.     No 7. CONTACTS-GLASSES: "Do you wear contacts or glasses?"     No 8. CAUSE: "What do you think is causing this visual problem?"     Unsure 9. OTHER SYMPTOMS: "Do you have any other symptoms?" (e.g., confusion, headache, arm or leg weakness, speech problems)     No 10.  PREGNANCY: "Is there any chance you are pregnant?" "When was your last menstrual period?"       N/a  Protocols used: Vision Loss or Change-A-AH

## 2022-08-12 ENCOUNTER — Encounter (HOSPITAL_COMMUNITY): Payer: Self-pay | Admitting: Emergency Medicine

## 2022-09-07 ENCOUNTER — Encounter: Payer: Self-pay | Admitting: Nurse Practitioner

## 2022-09-07 ENCOUNTER — Ambulatory Visit (INDEPENDENT_AMBULATORY_CARE_PROVIDER_SITE_OTHER): Payer: Medicaid Other | Admitting: Nurse Practitioner

## 2022-09-07 VITALS — BP 104/67 | HR 84 | Temp 97.6°F | Ht 67.0 in | Wt 171.0 lb

## 2022-09-07 DIAGNOSIS — H209 Unspecified iridocyclitis: Secondary | ICD-10-CM

## 2022-09-07 DIAGNOSIS — Z1321 Encounter for screening for nutritional disorder: Secondary | ICD-10-CM

## 2022-09-07 DIAGNOSIS — Z0000 Encounter for general adult medical examination without abnormal findings: Secondary | ICD-10-CM | POA: Diagnosis not present

## 2022-09-07 DIAGNOSIS — F32A Depression, unspecified: Secondary | ICD-10-CM | POA: Diagnosis not present

## 2022-09-07 DIAGNOSIS — Z1329 Encounter for screening for other suspected endocrine disorder: Secondary | ICD-10-CM | POA: Diagnosis not present

## 2022-09-07 DIAGNOSIS — H4041X3 Glaucoma secondary to eye inflammation, right eye, severe stage: Secondary | ICD-10-CM

## 2022-09-07 DIAGNOSIS — Z13228 Encounter for screening for other metabolic disorders: Secondary | ICD-10-CM

## 2022-09-07 DIAGNOSIS — Z130 Encounter for screening for diseases of the blood and blood-forming organs and certain disorders involving the immune mechanism: Secondary | ICD-10-CM

## 2022-09-07 HISTORY — DX: Unspecified iridocyclitis: H20.9

## 2022-09-07 NOTE — Assessment & Plan Note (Signed)
Annual exam as documented.  Counseling done include healthy lifestyle involving committing to 150 minutes of exercise per week, heart healthy diet, and attaining healthy weight. The importance of adequate sleep also discussed.  Regular use of seat belt and home safety were also discussed . Changes in health habits are decided on by patient with goals and time frames set for achieving them. Immunization and cancer screening  needs are specifically addressed at this visit.    Routine fasting abs ordered

## 2022-09-07 NOTE — Assessment & Plan Note (Signed)
Flowsheet Row Office Visit from 09/07/2022 in [facility]  PHQ-9 Total Score 7     He refused meds , declined referral for counseling He denies SI, HI  States that he will pray to God

## 2022-09-07 NOTE — Patient Instructions (Signed)

## 2022-09-07 NOTE — Progress Notes (Signed)
Complete physical exam  Patient: Adam Contreras   DOB: 09-15-1980   42 y.o. Male  MRN: [id number]  Subjective:    Chief Complaint  Patient presents with   Establish Care    Physical    Adam Contreras is a 42 y.o. male  has a past medical history of Blind left eye, Glaucoma, Head injury, and Tuberculosis. who presents today for a complete physical exam. He reports consuming a general diet. The patient does not participate in regular exercise at present.  Lives with his room mate.  Previous PCP Adam Contreras , last seen in 2016.   Glaucoma. He is been followed by opthamologist.  Currently on diamox 500mg  BID, Brimonidine BID. He is depressed due to not bee able to work as result of his impaired vision.   He is accompanied by a medical interpreter who assisted with interpretation.   Most recent fall risk assessment:    09/07/2022    9:54 AM  Fall Risk   Falls in the past year? 0  Number falls in past yr: 0  Injury with Fall? 0  Risk for fall due to : No Fall Risks  Follow up Falls evaluation completed     Most recent depression screenings:    09/07/2022    9:55 AM 10/15/2014    9:27 AM  PHQ 2/9 Scores  PHQ - 2 Score 4 0  PHQ- 9 Score 7         Patient Care Team: Patient, No Pcp Per as PCP - General (General Practice)   Outpatient Medications Prior to Visit  Medication Sig   acetaZOLAMIDE ER (DIAMOX) 500 MG capsule Take by mouth.   brimonidine (ALPHAGAN) 0.2 % ophthalmic solution Place 1 drop into the right eye 2 (two) times daily.   [DISCONTINUED] hydrocortisone (ANUSOL-HC) 2.5 % rectal cream Place 1 application rectally 2 (two) times daily. (Patient not taking: Reported on 09/07/2022)   [DISCONTINUED] ibuprofen (ADVIL) 800 MG tablet Take 1 tablet (800 mg total) by mouth 3 (three) times daily. (Patient not taking: Reported on 09/07/2022)   [DISCONTINUED] lidocaine (XYLOCAINE) 5 % ointment Apply 1 application topically as needed. (Patient not taking: Reported on 09/07/2022)    [DISCONTINUED] nortriptyline (PAMELOR) 25 MG capsule Take 1 capsule (25 mg total) by mouth at bedtime. (Patient not taking: Reported on 09/07/2022)   No facility-administered medications prior to visit.    Review of Systems  Constitutional:  Negative for chills, fatigue, fever and unexpected weight change.  HENT:  Negative for congestion, dental problem, drooling and ear discharge.   Eyes:  Positive for visual disturbance.  Respiratory:  Negative for apnea, cough, choking, chest tightness and shortness of breath.   Cardiovascular: Negative.  Negative for chest pain, palpitations and leg swelling.  Gastrointestinal: Negative.  Negative for abdominal distention, abdominal pain, anal bleeding and blood in stool.  Genitourinary:  Negative for difficulty urinating, enuresis, flank pain and genital sores.  Skin: Negative.  Negative for color change, pallor, rash and wound.  Neurological:  Negative for dizziness, facial asymmetry, light-headedness, numbness and headaches.  Psychiatric/Behavioral:  Negative for agitation, behavioral problems, confusion and decreased concentration.        Objective:     BP 104/67   Pulse 84   Temp 97.6 F (36.4 C)   Ht 5\' 7"  (1.702 m)   Wt 171 lb (77.6 kg)   SpO2 100%   BMI 26.78 kg/m    Physical Exam Vitals and nursing note reviewed. Exam conducted with a chaperone  present.  Constitutional:      General: He is not in acute distress.    Appearance: Normal appearance. He is obese. He is not ill-appearing, toxic-appearing or diaphoretic.  HENT:     Right Ear: Tympanic membrane, ear canal and external ear normal. There is no impacted cerumen.     Left Ear: Tympanic membrane, ear canal and external ear normal. There is no impacted cerumen.     Nose: Nose normal. No congestion or rhinorrhea.     Mouth/Throat:     Mouth: Mucous membranes are moist.     Pharynx: Oropharynx is clear. No oropharyngeal exudate or posterior oropharyngeal erythema.  Eyes:      Extraocular Movements: Extraocular movements intact.     Conjunctiva/sclera: Conjunctivae normal.     Comments: Blind in the left eye. Glaucoma in the right eye  Neck:     Vascular: No carotid bruit.  Cardiovascular:     Rate and Rhythm: Normal rate and regular rhythm.     Pulses: Normal pulses.     Heart sounds: Normal heart sounds. No murmur heard.    No friction rub. No gallop.  Pulmonary:     Effort: Pulmonary effort is normal. No respiratory distress.     Breath sounds: Normal breath sounds. No stridor. No wheezing, rhonchi or rales.  Chest:     Chest wall: No tenderness.  Abdominal:     General: Bowel sounds are normal. There is no distension.     Palpations: Abdomen is soft. There is no mass.     Tenderness: There is no abdominal tenderness. There is no right CVA tenderness, left CVA tenderness, guarding or rebound.     Hernia: No hernia is present.  Musculoskeletal:        General: No swelling, tenderness, deformity or signs of injury.     Cervical back: Normal range of motion and neck supple. No rigidity or tenderness.     Right lower leg: No edema.     Left lower leg: No edema.  Lymphadenopathy:     Cervical: No cervical adenopathy.  Skin:    General: Skin is warm and dry.     Capillary Refill: Capillary refill takes 2 to 3 seconds.     Coloration: Skin is not jaundiced or pale.     Findings: No bruising, erythema, lesion or rash.  Neurological:     Mental Status: He is alert and oriented to person, place, and time.     Cranial Nerves: No cranial nerve deficit.     Sensory: No sensory deficit.     Motor: No weakness.     Coordination: Coordination normal.     Gait: Gait normal.     Deep Tendon Reflexes: Reflexes normal.  Psychiatric:        Mood and Affect: Mood normal.        Behavior: Behavior normal.        Thought Content: Thought content normal.        Judgment: Judgment normal.     No results found for any visits on 09/07/22.     Assessment & Plan:     Routine Health Maintenance and Physical Exam   There is no immunization history on file for this patient.  Health Maintenance  Topic Date Due   HIV Screening  Never done   Hepatitis C Screening  Never done   DTaP/Tdap/Td (1 - Tdap) Never done   COVID-19 Vaccine (1 - 2023-24 season) Never done   INFLUENZA VACCINE  09/16/2022   HPV VACCINES  Aged Out    Discussed health benefits of physical activity, and encouraged him to engage in regular exercise appropriate for his age and condition.  Problem List Items Addressed This Visit       Other   Annual physical exam - Primary    Annual exam as documented.  Counseling done include healthy lifestyle involving committing to 150 minutes of exercise per week, heart healthy diet, and attaining healthy weight. The importance of adequate sleep also discussed.  Regular use of seat belt and home safety were also discussed . Changes in health habits are decided on by patient with goals and time frames set for achieving them. Immunization and cancer screening  needs are specifically addressed at this visit.    Routine fasting abs ordered      Screening for endocrine, nutritional, metabolic and immunity disorder   Relevant Orders   HIV Antibody (routine testing w rflx)   Lipid panel   TSH   CBC with Differential/Platelet   CMP14+EGFR   Hepatitis C antibody   Depression    Flowsheet Row Office Visit from 09/07/2022 in [facility]  PHQ-9 Total Score 7     He refused meds , declined referral for counseling He denies SI, HI  States that he will pray to God        Uveitic glaucoma, right, severe stage    Continue diamox and brimonidine as ordered Maintain close follow up with opthamologist       Relevant Medications   brimonidine (ALPHAGAN) 0.2 % ophthalmic solution   Return in about 1 year (around 09/07/2023) for CPE, FASTING LABS THIS WEEK.     Donell Beers, FNP

## 2022-09-07 NOTE — Assessment & Plan Note (Signed)
Continue diamox and brimonidine as ordered Maintain close follow up with opthamologist

## 2022-09-08 ENCOUNTER — Other Ambulatory Visit: Payer: Medicaid Other

## 2022-09-08 DIAGNOSIS — Z130 Encounter for screening for diseases of the blood and blood-forming organs and certain disorders involving the immune mechanism: Secondary | ICD-10-CM

## 2022-09-09 LAB — CMP14+EGFR
ALT: 34 IU/L (ref 0–44)
AST: 21 IU/L (ref 0–40)
Albumin: 4.4 g/dL (ref 4.1–5.1)
Alkaline Phosphatase: 121 IU/L (ref 44–121)
BUN/Creatinine Ratio: 23 — ABNORMAL HIGH (ref 9–20)
BUN: 16 mg/dL (ref 6–24)
CO2: 21 mmol/L (ref 20–29)
Calcium: 9.5 mg/dL (ref 8.7–10.2)
Chloride: 102 mmol/L (ref 96–106)
Glucose: 279 mg/dL — ABNORMAL HIGH (ref 70–99)
Potassium: 3.8 mmol/L (ref 3.5–5.2)
Sodium: 137 mmol/L (ref 134–144)
Total Protein: 7.5 g/dL (ref 6.0–8.5)
eGFR: 118 mL/min/{1.73_m2} (ref >59)

## 2022-09-09 LAB — LIPID PANEL
Chol/HDL Ratio: 3.7 ratio (ref 0.0–5.0)
HDL: 52 mg/dL (ref >39)
LDL Chol Calc (NIH): 124 mg/dL — ABNORMAL HIGH (ref 0–99)
Triglycerides: 77 mg/dL (ref 0–149)
VLDL Cholesterol Cal: 14 mg/dL (ref 5–40)

## 2022-09-09 LAB — CBC WITH DIFFERENTIAL/PLATELET
Basophils Absolute: 0 10*3/uL (ref 0.0–0.2)
EOS (ABSOLUTE): 0.1 10*3/uL (ref 0.0–0.4)
Eos: 3 %
Lymphs: 40 %
MCH: 27.1 pg (ref 26.6–33.0)
MCV: 84 fL (ref 79–97)
Neutrophils Absolute: 2.2 10*3/uL (ref 1.4–7.0)
Neutrophils: 50 %
Platelets: 244 10*3/uL (ref 150–450)
RDW: 13 % (ref 11.6–15.4)
WBC: 4.5 10*3/uL (ref 3.4–10.8)

## 2022-09-09 LAB — HIV ANTIBODY (ROUTINE TESTING W REFLEX): HIV Screen 4th Generation wRfx: NONREACTIVE

## 2022-09-09 LAB — HEPATITIS C ANTIBODY: Hep C Virus Ab: NONREACTIVE

## 2022-09-09 LAB — TSH: TSH: 1.98 u[IU]/mL (ref 0.450–4.500)

## 2022-09-10 ENCOUNTER — Other Ambulatory Visit: Payer: Self-pay | Admitting: Nurse Practitioner

## 2022-09-10 DIAGNOSIS — R739 Hyperglycemia, unspecified: Secondary | ICD-10-CM

## 2022-09-11 ENCOUNTER — Other Ambulatory Visit: Payer: Self-pay

## 2022-09-11 ENCOUNTER — Other Ambulatory Visit: Payer: Self-pay | Admitting: Nurse Practitioner

## 2022-09-11 DIAGNOSIS — E1169 Type 2 diabetes mellitus with other specified complication: Secondary | ICD-10-CM

## 2022-09-11 MED ORDER — ATORVASTATIN CALCIUM 10 MG PO TABS
10.0000 mg | ORAL_TABLET | Freq: Every day | ORAL | 1 refills | Status: DC
Start: 2022-09-11 — End: 2022-10-25
  Filled 2022-09-11: qty 90, 90d supply, fill #0

## 2022-09-11 MED ORDER — METFORMIN HCL ER 500 MG PO TB24
500.0000 mg | ORAL_TABLET | Freq: Two times a day (BID) | ORAL | 2 refills | Status: DC
Start: 2022-09-11 — End: 2022-10-25
  Filled 2022-09-11: qty 60, 30d supply, fill #0

## 2022-09-13 ENCOUNTER — Other Ambulatory Visit: Payer: Self-pay

## 2022-09-20 ENCOUNTER — Other Ambulatory Visit: Payer: Self-pay

## 2022-10-02 LAB — SPECIMEN STATUS REPORT

## 2022-10-15 ENCOUNTER — Ambulatory Visit: Payer: Self-pay | Admitting: Nurse Practitioner

## 2022-10-25 ENCOUNTER — Encounter: Payer: Self-pay | Admitting: Nurse Practitioner

## 2022-10-25 ENCOUNTER — Ambulatory Visit (INDEPENDENT_AMBULATORY_CARE_PROVIDER_SITE_OTHER): Payer: Medicaid Other | Admitting: Nurse Practitioner

## 2022-10-25 VITALS — BP 108/81 | HR 80 | Temp 97.1°F | Wt 167.0 lb

## 2022-10-25 DIAGNOSIS — E1165 Type 2 diabetes mellitus with hyperglycemia: Secondary | ICD-10-CM

## 2022-10-25 DIAGNOSIS — E1169 Type 2 diabetes mellitus with other specified complication: Secondary | ICD-10-CM | POA: Insufficient documentation

## 2022-10-25 DIAGNOSIS — E785 Hyperlipidemia, unspecified: Secondary | ICD-10-CM

## 2022-10-25 HISTORY — DX: Hyperlipidemia, unspecified: E78.5

## 2022-10-25 HISTORY — DX: Type 2 diabetes mellitus with hyperglycemia: E11.65

## 2022-10-25 MED ORDER — LANCET DEVICE MISC
1.0000 | Freq: Three times a day (TID) | 0 refills | Status: AC
Start: 2022-10-25 — End: 2022-11-24

## 2022-10-25 MED ORDER — BLOOD GLUCOSE MONITORING SUPPL DEVI
1.0000 | Freq: Three times a day (TID) | 0 refills | Status: AC
Start: 2022-10-25 — End: ?

## 2022-10-25 MED ORDER — LANCETS MISC. MISC
1.0000 | Freq: Three times a day (TID) | 0 refills | Status: DC
Start: 2022-10-25 — End: 2022-11-22

## 2022-10-25 MED ORDER — ATORVASTATIN CALCIUM 10 MG PO TABS
10.0000 mg | ORAL_TABLET | Freq: Every day | ORAL | 1 refills | Status: DC
Start: 2022-10-25 — End: 2023-03-22

## 2022-10-25 MED ORDER — BLOOD GLUCOSE TEST VI STRP
1.0000 | ORAL_STRIP | Freq: Three times a day (TID) | 0 refills | Status: AC
Start: 2022-10-25 — End: 2022-11-24

## 2022-10-25 MED ORDER — METFORMIN HCL ER 500 MG PO TB24
500.0000 mg | ORAL_TABLET | Freq: Two times a day (BID) | ORAL | 2 refills | Status: DC
Start: 2022-10-25 — End: 2022-11-22

## 2022-10-25 NOTE — Progress Notes (Addendum)
Established Patient Office Visit  Subjective:  Patient ID: Adam Contreras, male    DOB: [birth year]  Age: 42 y.o. MRN: [id number]  CC:  Chief Complaint  Patient presents with   Follow-up   Diabetes    And HDL    HPI Adam Contreras is a 42 y.o. male with past medical history of uncontrolled type 2 diabetes, hyperlipidemia glaucoma who presents for follow-up for diabetes and hyperlipidemia  Patient was diagnosed with type 2 diabetes at his last appointment.  Metformin 500 mg twice daily was ordered, atorvastatin 10 mg daily was ordered for hyperlipidemia.  Patient stated that he did not pick up the prescriptions.  He denies polyuria polydipsia he reports polyphagia   Interpretation services provided via [facility] health iPad.       Past Medical History:  Diagnosis Date   Blind left eye    Glaucoma    Head injury    Tuberculosis     No past surgical history on file.  Family History  Family history unknown: Yes    Social History   Socioeconomic History   Marital status: Married    Spouse name: Not on file   Number of children: 2   Years of education: Not on file   Highest education level: Not on file  Occupational History   Not on file  Tobacco Use   Smoking status: Never   Smokeless tobacco: Not on file  Substance and Sexual Activity   Alcohol use: No    Alcohol/week: 0.0 standard drinks of alcohol   Drug use: Never   Sexual activity: Not Currently  Other Topics Concern   Not on file  Social History Narrative   Lives with his room mate    Social Determinants of Health   Financial Resource Strain: Not on file  Food Insecurity: Not on file  Transportation Needs: Not on file  Physical Activity: Not on file  Stress: Not on file  Social Connections: Not on file  Intimate Partner Violence: Not on file    Outpatient Medications Prior to Visit  Medication Sig Dispense Refill   brimonidine (ALPHAGAN) 0.2 % ophthalmic solution Place 1 drop into the right eye 2  (two) times daily.     acetaZOLAMIDE ER (DIAMOX) 500 MG capsule Take by mouth. (Patient not taking: Reported on 10/25/2022)     atorvastatin (LIPITOR) 10 MG tablet Take 1 tablet (10 mg total) by mouth daily. (Patient not taking: Reported on 10/25/2022) 90 tablet 1   metFORMIN (GLUCOPHAGE-XR) 500 MG 24 hr tablet Take 1 tablet (500 mg total) by mouth 2 (two) times daily with a meal. (Patient not taking: Reported on 10/25/2022) 60 tablet 2   No facility-administered medications prior to visit.    No Known Allergies  ROS Review of Systems  Constitutional:  Negative for activity change, appetite change, chills, diaphoresis, fatigue, fever and unexpected weight change.  HENT:  Negative for congestion, dental problem, drooling and ear discharge.   Eyes:  Positive for visual disturbance.  Respiratory:  Negative for apnea, cough, choking, chest tightness, shortness of breath and wheezing.   Cardiovascular: Negative.  Negative for chest pain, palpitations and leg swelling.  Gastrointestinal:  Negative for abdominal distention, abdominal pain, anal bleeding, blood in stool, constipation, diarrhea and vomiting.  Endocrine: Positive for polyphagia. Negative for polydipsia and polyuria.  Genitourinary:  Negative for difficulty urinating, flank pain, frequency and genital sores.  Musculoskeletal: Negative.  Negative for arthralgias, back pain, gait problem and joint swelling.  Skin:  Negative for color change, pallor and rash.  Neurological:  Negative for dizziness, facial asymmetry, light-headedness, numbness and headaches.  Psychiatric/Behavioral:  Negative for agitation, behavioral problems, confusion, hallucinations, self-injury, sleep disturbance and suicidal ideas.       Objective:    Physical Exam Vitals and nursing note reviewed.  Constitutional:      General: He is not in acute distress.    Appearance: Normal appearance. He is obese. He is not ill-appearing, toxic-appearing or diaphoretic.   Cardiovascular:     Rate and Rhythm: Normal rate and regular rhythm.     Pulses: Normal pulses.     Heart sounds: Normal heart sounds. No murmur heard.    No friction rub. No gallop.  Pulmonary:     Effort: Pulmonary effort is normal. No respiratory distress.     Breath sounds: Normal breath sounds. No stridor. No wheezing, rhonchi or rales.  Chest:     Chest wall: No tenderness.  Abdominal:     General: There is no distension.     Palpations: Abdomen is soft.     Tenderness: There is no abdominal tenderness. There is no right CVA tenderness, left CVA tenderness or guarding.  Musculoskeletal:        General: No swelling, tenderness, deformity or signs of injury.     Right lower leg: No edema.     Left lower leg: No edema.  Skin:    General: Skin is warm and dry.     Capillary Refill: Capillary refill takes less than 2 seconds.     Coloration: Skin is not jaundiced or pale.     Findings: No bruising, erythema or lesion.  Neurological:     Mental Status: He is alert and oriented to person, place, and time.     Motor: No weakness.     Coordination: Coordination normal.     Gait: Gait normal.  Psychiatric:        Mood and Affect: Mood normal.        Behavior: Behavior normal.        Thought Content: Thought content normal.        Judgment: Judgment normal.     BP 108/81   Pulse 80   Temp (!) 97.1 F (36.2 C)   Wt 167 lb (75.8 kg)   BMI 26.16 kg/m  Wt Readings from Last 3 Encounters:  10/25/22 167 lb (75.8 kg)  09/07/22 171 lb (77.6 kg)  11/12/14 178 lb (80.7 kg)    Lab Results  Component Value Date   TSH 1.980 09/08/2022   Lab Results  Component Value Date   WBC 4.5 09/08/2022   HGB 14.5 09/08/2022   HCT 44.9 09/08/2022   MCV 84 09/08/2022   PLT 244 09/08/2022   Lab Results  Component Value Date   NA 137 09/08/2022   K 3.8 09/08/2022   CO2 21 09/08/2022   GLUCOSE 279 (H) 09/08/2022   BUN 16 09/08/2022   CREATININE 0.71 (L) 09/08/2022   BILITOT 0.4  09/08/2022   ALKPHOS 121 09/08/2022   AST 21 09/08/2022   ALT 34 09/08/2022   PROT 7.5 09/08/2022   ALBUMIN 4.4 09/08/2022   CALCIUM 9.5 09/08/2022   EGFR 118 09/08/2022   Lab Results  Component Value Date   CHOL 190 09/08/2022   Lab Results  Component Value Date   HDL 52 09/08/2022   Lab Results  Component Value Date   LDLCALC 124 (H) 09/08/2022   Lab Results  Component Value  Date   TRIG 77 09/08/2022   Lab Results  Component Value Date   CHOLHDL 3.7 09/08/2022   Lab Results  Component Value Date   HGBA1C 11.7 (H) 09/08/2022      Assessment & Plan:   Problem List Items Addressed This Visit       Endocrine   Uncontrolled type 2 diabetes mellitus with hyperglycemia (HCC) - Primary    Lab Results  Component Value Date   HGBA1C 11.7 (H) 09/08/2022  Start metformin 500 mg twice daily, atorvastatin 10 mg daily Patient encouraged to report abdominal pain nausea vomiting Patient counseled on low-carb modified diet Encouraged engage in regular moderate exercises at least [place] 50 minutes weekly Diabetic foot exam completed checking urine microalbumin labs CBG goals discussed with the patient encouraged to check blood sugar keep a log and bring to next appointment Patient referred for diabetes education Glucometer and supply ordered      Relevant Medications   metFORMIN (GLUCOPHAGE-XR) 500 MG 24 hr tablet   atorvastatin (LIPITOR) 10 MG tablet   Blood Glucose Monitoring Suppl DEVI   Glucose Blood (BLOOD GLUCOSE TEST STRIPS) STRP   Lancet Device MISC   Lancets Misc. MISC   Other Relevant Orders   Microalbumin / creatinine urine ratio   Ambulatory referral to diabetic education     Other   Dyslipidemia, goal LDL below 70    Lab Results  Component Value Date   CHOL 190 09/08/2022   HDL 52 09/08/2022   LDLCALC 124 (H) 09/08/2022   TRIG 77 09/08/2022   CHOLHDL 3.7 09/08/2022  Start atorvastatin 10 mg daily Will repeat labs in 4 weeks      Relevant  Medications   atorvastatin (LIPITOR) 10 MG tablet    Meds ordered this encounter  Medications   metFORMIN (GLUCOPHAGE-XR) 500 MG 24 hr tablet    Sig: Take 1 tablet (500 mg total) by mouth 2 (two) times daily with a meal.    Dispense:  60 tablet    Refill:  2   atorvastatin (LIPITOR) 10 MG tablet    Sig: Take 1 tablet (10 mg total) by mouth daily.    Dispense:  90 tablet    Refill:  1   Blood Glucose Monitoring Suppl DEVI    Sig: 1 each by Does not apply route in the morning, at noon, and at bedtime. May substitute to any manufacturer covered by patient's insurance.    Dispense:  1 each    Refill:  0   Glucose Blood (BLOOD GLUCOSE TEST STRIPS) STRP    Sig: 1 each by In Vitro route in the morning, at noon, and at bedtime. May substitute to any manufacturer covered by patient's insurance.    Dispense:  100 strip    Refill:  0   Lancet Device MISC    Sig: 1 each by Does not apply route in the morning, at noon, and at bedtime. May substitute to any manufacturer covered by patient's insurance.    Dispense:  1 each    Refill:  0   Lancets Misc. MISC    Sig: 1 each by Does not apply route in the morning, at noon, and at bedtime. May substitute to any manufacturer covered by patient's insurance.    Dispense:  100 each    Refill:  0    Follow-up: Return in about 4 weeks (around 11/22/2022) for DM, HYPERLIPIDEMIA.    Donell Beers, FNP

## 2022-10-25 NOTE — Patient Instructions (Addendum)
Goal for fasting blood sugar ranges from 80 to 120 and 2 hours after any meal or at bedtime should be between 130 to 170.    1. Uncontrolled type 2 diabetes mellitus with hyperglycemia (HCC)  - metFORMIN (GLUCOPHAGE-XR) 500 MG 24 hr tablet; Take 1 tablet (500 mg total) by mouth 2 (two) times daily with a meal.  Dispense: 60 tablet; Refill: 2 - atorvastatin (LIPITOR) 10 MG tablet; Take 1 tablet (10 mg total) by mouth daily.  Dispense: 90 tablet; Refill: 1 - Microalbumin / creatinine urine ratio - Ambulatory referral to diabetic education  2. Dyslipidemia, goal LDL below 70   It is important that you exercise regularly at least 30 minutes 5 times a week as tolerated  Think about what you will eat, plan ahead. Choose " clean, green, fresh or frozen" over canned, processed or packaged foods which are more sugary, salty and fatty. 70 to 75% of food eaten should be vegetables and fruit. Three meals at set times with snacks allowed between meals, but they must be fruit or vegetables. Aim to eat over a 12 hour period , example 7 am to 7 pm, and STOP after  your last meal of the day. Drink water,generally about 64 ounces per day, no other drink is as healthy. Fruit juice is best enjoyed in a healthy way, by EATING the fruit.  Thanks for choosing [facility] we consider it a privelige to serve you.

## 2022-10-25 NOTE — Assessment & Plan Note (Addendum)
Lab Results  Component Value Date   HGBA1C 11.7 (H) 09/08/2022  Start metformin 500 mg twice daily, atorvastatin 10 mg daily Patient encouraged to report abdominal pain nausea vomiting Patient counseled on low-carb modified diet Encouraged engage in regular moderate exercises at least [place] 50 minutes weekly Diabetic foot exam completed checking urine microalbumin labs CBG goals discussed with the patient encouraged to check blood sugar keep a log and bring to next appointment Patient referred for diabetes education Glucometer and supply ordered

## 2022-10-25 NOTE — Assessment & Plan Note (Signed)
Lab Results  Component Value Date   CHOL 190 09/08/2022   HDL 52 09/08/2022   LDLCALC 124 (H) 09/08/2022   TRIG 77 09/08/2022   CHOLHDL 3.7 09/08/2022  Start atorvastatin 10 mg daily Will repeat labs in 4 weeks

## 2022-10-26 LAB — MICROALBUMIN / CREATININE URINE RATIO
Creatinine, Urine: 121.5 mg/dL
Microalb/Creat Ratio: 10 mg/g{creat} (ref 0–29)
Microalbumin, Urine: 12.6 ug/mL

## 2022-10-26 NOTE — Progress Notes (Signed)
Will review results with patient at his upcoming appointment

## 2022-11-22 ENCOUNTER — Other Ambulatory Visit: Payer: Self-pay

## 2022-11-22 ENCOUNTER — Ambulatory Visit (INDEPENDENT_AMBULATORY_CARE_PROVIDER_SITE_OTHER): Payer: Medicaid Other | Admitting: Nurse Practitioner

## 2022-11-22 ENCOUNTER — Encounter: Payer: Self-pay | Admitting: Nurse Practitioner

## 2022-11-22 VITALS — BP 96/70 | HR 79 | Temp 97.9°F | Wt 167.2 lb

## 2022-11-22 DIAGNOSIS — E1165 Type 2 diabetes mellitus with hyperglycemia: Secondary | ICD-10-CM | POA: Diagnosis not present

## 2022-11-22 DIAGNOSIS — E785 Hyperlipidemia, unspecified: Secondary | ICD-10-CM

## 2022-11-22 MED ORDER — METFORMIN HCL ER 500 MG PO TB24
1000.0000 mg | ORAL_TABLET | Freq: Two times a day (BID) | ORAL | 3 refills | Status: DC
Start: 2022-11-22 — End: 2023-05-23
  Filled 2022-11-22: qty 120, 30d supply, fill #0

## 2022-11-22 NOTE — Assessment & Plan Note (Signed)
Lab Results  Component Value Date   HGBA1C 11.7 (H) 09/08/2022  Doing well on metformin 500 mg twice daily Start metformin 1000 mg twice daily Patient counseled on low-carb modified diet, signs of hypoglycemia reviewed Will recheck A1c in 2 months

## 2022-11-22 NOTE — Patient Instructions (Signed)
1. Uncontrolled type 2 diabetes mellitus with hyperglycemia (HCC)  - POCT glycosylated hemoglobin (Hb A1C) - metFORMIN (GLUCOPHAGE-XR) 500 MG 24 hr tablet; Take 2 tablets (1,000 mg total) by mouth 2 (two) times daily with a meal.  Dispense: 120 tablet; Refill: 3  2. Dyslipidemia, goal LDL below 70  - LDL Cholesterol, Direct      It is important that you exercise regularly at least 30 minutes 5 times a week as tolerated  Think about what you will eat, plan ahead. Choose " clean, green, fresh or frozen" over canned, processed or packaged foods which are more sugary, salty and fatty. 70 to 75% of food eaten should be vegetables and fruit. Three meals at set times with snacks allowed between meals, but they must be fruit or vegetables. Aim to eat over a 12 hour period , example 7 am to 7 pm, and STOP after  your last meal of the day. Drink water,generally about 64 ounces per day, no other drink is as healthy. Fruit juice is best enjoyed in a healthy way, by EATING the fruit.  Thanks for choosing [facility] we consider it a privelige to serve you.

## 2022-11-22 NOTE — Progress Notes (Signed)
Established Patient Office Visit  Subjective:  Patient ID: Adam Contreras, male    DOB: 1980/03/29  Age: 42 y.o. MRN: [id number]  CC:  Chief Complaint  Patient presents with   Hypertension   Diabetes    HPI Adam Contreras is a 42 y.o. 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  Patient presents for follow-up for type 2 diabetes  Type 2 diabetes.  Currently on metformin 500 mg twice daily, atorvastatin 10 mg daily for hyperlipidemia.  Patient reports random blood sugar readings of 200s.  He is not able to check his blood sugar regularly due to his impaired vision, he has been trying to follow a low-carb modified diet.  He recently had his retina eye exam done by the ophthalmologist  Patient denies any adverse reactions to current medication  Patient is accompanied by his friend.    Past Medical History:  Diagnosis Date   Blind left eye    Blind left eye 09/06/2014   Dyslipidemia, goal LDL below 70 10/25/2022   Glaucoma    Head injury    Tuberculosis    Uncontrolled type 2 diabetes mellitus with hyperglycemia (HCC) 10/25/2022   Uveitic glaucoma, right, severe stage 09/07/2022    History reviewed. No pertinent surgical history.  Family History  Family history unknown: Yes    Social History   Socioeconomic History   Marital status: Married    Spouse name: Not on file   Number of children: 2   Years of education: Not on file   Highest education level: Not on file  Occupational History   Not on file  Tobacco Use   Smoking status: Never   Smokeless tobacco: Not on file  Substance and Sexual Activity   Alcohol use: No    Alcohol/week: 0.0 standard drinks of alcohol   Drug use: Never   Sexual activity: Not Currently   Other Topics Concern   Not on file  Social History Narrative   Lives with his room mate    Social Determinants of Health   Financial Resource Strain: Not on file  Food Insecurity: Not on file  Transportation Needs: Not on file  Physical Activity: Not on file  Stress: Not on file  Social Connections: Not on file  Intimate Partner Violence: Not on file    Outpatient Medications Prior to Visit  Medication Sig Dispense Refill   atorvastatin (LIPITOR) 10 MG tablet Take 1 tablet (10 mg total) by mouth daily. 90 tablet 1   Blood Glucose Monitoring Suppl DEVI 1 each by Does not apply route in the morning, at noon, and at bedtime. May substitute to any manufacturer covered by patient's insurance. 1 each 0   brimonidine (ALPHAGAN) 0.2 % ophthalmic solution Place 1 drop into the right eye 2 (two) times daily.     Glucose Blood (BLOOD GLUCOSE TEST STRIPS) STRP 1 each by In Vitro route in the morning, at noon, and at bedtime. May substitute to any manufacturer covered by patient's insurance. 100 strip 0   Lancet Device MISC 1 each by Does not apply route in the morning, at noon, and at bedtime. May substitute to any manufacturer covered by patient's insurance. 1 each 0   metFORMIN (GLUCOPHAGE-XR) 500 MG 24 hr tablet Take 1 tablet (500 mg total) by mouth 2 (two)  times daily with a meal. 60 tablet 2   acetaZOLAMIDE ER (DIAMOX) 500 MG capsule Take by mouth. (Patient not taking: Reported on 10/25/2022)     Lancets Misc. MISC 1 each by Does not apply route in the morning, at noon, and at bedtime. May substitute to any manufacturer covered by patient's insurance. 100 each 0   No facility-administered medications prior to visit.    No Known Allergies  ROS Review of Systems  Constitutional:  Negative for appetite change, chills, fatigue and fever.  HENT:  Negative for congestion, postnasal drip, rhinorrhea and sneezing.   Respiratory:  Negative for cough, shortness of breath and wheezing.    Cardiovascular:  Negative for chest pain, palpitations and leg swelling.  Gastrointestinal:  Negative for abdominal pain, constipation, nausea and vomiting.  Genitourinary:  Negative for difficulty urinating, dysuria, flank pain and frequency.  Musculoskeletal:  Negative for arthralgias, back pain, joint swelling and myalgias.  Skin:  Negative for color change, pallor, rash and wound.  Neurological:  Negative for dizziness, facial asymmetry, weakness, numbness and headaches.  Psychiatric/Behavioral:  Negative for behavioral problems, confusion, self-injury and suicidal ideas.       Objective:    Physical Exam Vitals and nursing note reviewed.  Constitutional:      General: He is not in acute distress.    Appearance: Normal appearance. He is not ill-appearing, toxic-appearing or diaphoretic.  Eyes:     Conjunctiva/sclera: Conjunctivae normal.  Cardiovascular:     Rate and Rhythm: Normal rate and regular rhythm.     Pulses: Normal pulses.     Heart sounds: Normal heart sounds. No murmur heard.    No friction rub. No gallop.  Pulmonary:     Effort: Pulmonary effort is normal. No respiratory distress.     Breath sounds: Normal breath sounds. No stridor. No wheezing, rhonchi or rales.  Chest:     Chest wall: No tenderness.  Abdominal:     General: There is no distension.     Palpations: Abdomen is soft.     Tenderness: There is no abdominal tenderness. There is no right CVA tenderness, left CVA tenderness or guarding.  Musculoskeletal:        General: No swelling, tenderness, deformity or signs of injury.     Right lower leg: No edema.     Left lower leg: No edema.  Skin:    General: Skin is warm and dry.     Capillary Refill: Capillary refill takes less than 2 seconds.     Coloration: Skin is not jaundiced or pale.     Findings: No bruising, erythema or lesion.  Neurological:     Mental Status: He is alert and oriented to person, place, and time.     Motor: No weakness.      Coordination: Coordination normal.     Gait: Gait normal.  Psychiatric:        Mood and Affect: Mood normal.        Behavior: Behavior normal.        Thought Content: Thought content normal.        Judgment: Judgment normal.     BP 96/70   Pulse 79   Temp 97.9 F (36.6 C)   Wt 167 lb 3.2 oz (75.8 kg)   SpO2 96%   BMI 26.19 kg/m  Wt Readings from Last 3 Encounters:  11/22/22 167 lb 3.2 oz (75.8 kg)  10/25/22 167 lb (75.8 kg)  09/07/22 171 lb (77.6 kg)  Lab Results  Component Value Date   TSH 1.980 09/08/2022   Lab Results  Component Value Date   WBC 4.5 09/08/2022   HGB 14.5 09/08/2022   HCT 44.9 09/08/2022   MCV 84 09/08/2022   PLT 244 09/08/2022   Lab Results  Component Value Date   NA 137 09/08/2022   K 3.8 09/08/2022   CO2 21 09/08/2022   GLUCOSE 279 (H) 09/08/2022   BUN 16 09/08/2022   CREATININE 0.71 (L) 09/08/2022   BILITOT 0.4 09/08/2022   ALKPHOS 121 09/08/2022   AST 21 09/08/2022   ALT 34 09/08/2022   PROT 7.5 09/08/2022   ALBUMIN 4.4 09/08/2022   CALCIUM 9.5 09/08/2022   EGFR 118 09/08/2022   Lab Results  Component Value Date   CHOL 190 09/08/2022   Lab Results  Component Value Date   HDL 52 09/08/2022   Lab Results  Component Value Date   LDLCALC 124 (H) 09/08/2022   Lab Results  Component Value Date   TRIG 77 09/08/2022   Lab Results  Component Value Date   CHOLHDL 3.7 09/08/2022   Lab Results  Component Value Date   HGBA1C 11.7 (H) 09/08/2022      Assessment & Plan:   Problem List Items Addressed This Visit       Endocrine   Uncontrolled type 2 diabetes mellitus with hyperglycemia (HCC) - Primary    Lab Results  Component Value Date   HGBA1C 11.7 (H) 09/08/2022  Doing well on metformin 500 mg twice daily Start metformin 1000 mg twice daily Patient counseled on low-carb modified diet, signs of hypoglycemia reviewed Will recheck A1c in 2 months      Relevant Medications   metFORMIN (GLUCOPHAGE-XR) 500 MG 24  hr tablet     Other   Dyslipidemia, goal LDL below 70    Currently on atorvastatin 10 mg daily Checking direct LDL, goal is less than 70      Relevant Orders   LDL Cholesterol, Direct    Meds ordered this encounter  Medications   metFORMIN (GLUCOPHAGE-XR) 500 MG 24 hr tablet    Sig: Take 2 tablets (1,000 mg total) by mouth 2 (two) times daily with a meal.    Dispense:  120 tablet    Refill:  3    Follow-up: Return in about 10 weeks (around 01/31/2023) for DM.    Donell Beers, FNP

## 2022-11-22 NOTE — Assessment & Plan Note (Signed)
Currently on atorvastatin 10 mg daily Checking direct LDL, goal is less than 70

## 2022-11-23 ENCOUNTER — Other Ambulatory Visit: Payer: Self-pay

## 2022-11-23 LAB — LDL CHOLESTEROL, DIRECT: LDL Direct: 62 mg/dL (ref 0–99)

## 2022-12-09 ENCOUNTER — Ambulatory Visit: Payer: Medicaid Other | Admitting: Internal Medicine

## 2023-01-31 ENCOUNTER — Encounter: Payer: Self-pay | Admitting: Nurse Practitioner

## 2023-01-31 ENCOUNTER — Ambulatory Visit (INDEPENDENT_AMBULATORY_CARE_PROVIDER_SITE_OTHER): Payer: Medicaid Other | Admitting: Nurse Practitioner

## 2023-01-31 VITALS — BP 108/67 | HR 78 | Temp 97.0°F | Wt 166.0 lb

## 2023-01-31 DIAGNOSIS — E1169 Type 2 diabetes mellitus with other specified complication: Secondary | ICD-10-CM | POA: Diagnosis not present

## 2023-01-31 DIAGNOSIS — E785 Hyperlipidemia, unspecified: Secondary | ICD-10-CM | POA: Diagnosis not present

## 2023-01-31 LAB — POCT GLYCOSYLATED HEMOGLOBIN (HGB A1C): Hemoglobin A1C: 8 % — AB (ref 4.0–5.6)

## 2023-01-31 MED ORDER — EMPAGLIFLOZIN 10 MG PO TABS
10.0000 mg | ORAL_TABLET | Freq: Every day | ORAL | 2 refills | Status: DC
Start: 2023-01-31 — End: 2023-02-01

## 2023-01-31 NOTE — Assessment & Plan Note (Addendum)
A1c 8.0 today Continue metformin 1000 mg twice daily Start Jardiance 10 mg daily, samples provided in the office Continue atorvastatin 10 mg daily for hyperlipidemia Patient counseled on low-carb modified diet Encouraged to engage in regular moderate exercises at least 150 minutes weekly Signs of hypoglycemia and treatment for hypoglycemia discussed Follow-up in 4 weeks  Addendum.  Jardiance not covered by the patient's insurance.  Farxiga 5 mg daily ordered, Jardiance discontinued.  The nurse will notify the patient

## 2023-01-31 NOTE — Progress Notes (Addendum)
Established Patient Office Visit  Subjective:  Patient ID: Adam Contreras, male    DOB: 16-Feb-1980  Age: 42 y.o. MRN: [id number]  CC:  Chief Complaint  Patient presents with   Medical Management of Chronic Issues    10 follow up     HPI Adam Contreras is a 42 y.o. 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  Patient present for follow-up for his chronic medical conditions Interpretation services provided via [facility]  Uncontrolled type 2 diabetes.  Currently on metformin 1000 mg twice daily, stated that he has been avoiding sweets, sugars, cutting down on high carbohydrate foods.  He does not check his blood sugars, denies signs of hypoglycemia, signs of hypoglycemia reviewed today.  Takes atorvastatin 10 mg daily for hyperlipidemia    Past Medical History:  Diagnosis Date   Blind left eye    Blind left eye 09/06/2014   Dyslipidemia, goal LDL below 70 10/25/2022   Glaucoma    Head injury    Tuberculosis    Uncontrolled type 2 diabetes mellitus with hyperglycemia (HCC) 10/25/2022   Uveitic glaucoma, right, severe stage 09/07/2022    History reviewed. No pertinent surgical history.  Family History  Family history unknown: Yes    Social History   Socioeconomic History   Marital status: Married    Spouse name: Not on file   Number of children: 2   Years of education: Not on file   Highest education level: Not on file  Occupational History   Not on file  Tobacco Use   Smoking status: Never   Smokeless tobacco: Not on file  Substance and Sexual Activity   Alcohol use: No    Alcohol/week: 0.0 standard drinks of alcohol   Drug use: Never   Sexual activity: Not Currently  Other Topics Concern   Not on file   Social History Narrative   Lives with his room mate    Social Drivers of Health   Financial Resource Strain: Not on file  Food Insecurity: Not on file  Transportation Needs: Not on file  Physical Activity: Not on file  Stress: Not on file  Social Connections: Not on file  Intimate Partner Violence: Not on file    Outpatient Medications Prior to Visit  Medication Sig Dispense Refill   atorvastatin (LIPITOR) 10 MG tablet Take 1 tablet (10 mg total) by mouth daily. 90 tablet 1   Blood Glucose Monitoring Suppl DEVI 1 each by Does not apply route in the morning, at noon, and at bedtime. May substitute to any manufacturer covered by patient's insurance. 1 each 0   brimonidine (ALPHAGAN) 0.2 % ophthalmic solution Place 1 drop into the right eye 2 (two) times daily.     metFORMIN (GLUCOPHAGE-XR) 500 MG 24 hr tablet Take 2 tablets (1,000 mg total) by mouth 2 (two) times daily with a meal. 120 tablet 3   acetaZOLAMIDE ER (DIAMOX) 500 MG capsule Take by mouth. (Patient not taking: Reported on 10/25/2022)     No facility-administered medications prior to visit.    No Known Allergies  ROS Review of Systems  Constitutional:  Negative for appetite change, chills, fatigue and fever.  HENT:  Negative for congestion, postnasal drip, rhinorrhea and sneezing.   Respiratory:  Negative for cough, shortness of breath and wheezing.  Cardiovascular:  Negative for chest pain, palpitations and leg swelling.  Gastrointestinal:  Negative for abdominal pain, constipation, nausea and vomiting.  Genitourinary:  Negative for difficulty urinating, dysuria, flank pain and frequency.  Musculoskeletal:  Negative for arthralgias, back pain, joint swelling and myalgias.  Skin:  Negative for color change, pallor, rash and wound.  Neurological:  Negative for dizziness, facial asymmetry, weakness, numbness and headaches.  Psychiatric/Behavioral:  Negative for behavioral problems, confusion, self-injury and suicidal  ideas.       Objective:    Physical Exam Vitals and nursing note reviewed.  Constitutional:      General: He is not in acute distress.    Appearance: Normal appearance. He is not ill-appearing, toxic-appearing or diaphoretic.  Cardiovascular:     Rate and Rhythm: Normal rate and regular rhythm.     Pulses: Normal pulses.     Heart sounds: Normal heart sounds. No murmur heard.    No friction rub. No gallop.  Pulmonary:     Effort: Pulmonary effort is normal. No respiratory distress.     Breath sounds: Normal breath sounds. No stridor. No wheezing, rhonchi or rales.  Chest:     Chest wall: No tenderness.  Abdominal:     General: There is no distension.     Palpations: Abdomen is soft.     Tenderness: There is no abdominal tenderness. There is no right CVA tenderness, left CVA tenderness or guarding.  Musculoskeletal:        General: No swelling, tenderness, deformity or signs of injury.     Right lower leg: No edema.     Left lower leg: No edema.  Skin:    General: Skin is warm and dry.     Capillary Refill: Capillary refill takes less than 2 seconds.     Coloration: Skin is not jaundiced or pale.     Findings: No bruising, erythema or lesion.  Neurological:     Mental Status: He is alert and oriented to person, place, and time.     Motor: No weakness.     Coordination: Coordination normal.     Gait: Gait normal.  Psychiatric:        Mood and Affect: Mood normal.        Behavior: Behavior normal.        Thought Content: Thought content normal.        Judgment: Judgment normal.     BP 108/67   Pulse 78   Temp (!) 97 F (36.1 C)   Wt 166 lb (75.3 kg)   SpO2 100%   BMI 26.00 kg/m  Wt Readings from Last 3 Encounters:  01/31/23 166 lb (75.3 kg)  11/22/22 167 lb 3.2 oz (75.8 kg)  10/25/22 167 lb (75.8 kg)    Lab Results  Component Value Date   TSH 1.980 09/08/2022   Lab Results  Component Value Date   WBC 4.5 09/08/2022   HGB 14.5 09/08/2022   HCT 44.9  09/08/2022   MCV 84 09/08/2022   PLT 244 09/08/2022   Lab Results  Component Value Date   NA 137 09/08/2022   K 3.8 09/08/2022   CO2 21 09/08/2022   GLUCOSE 279 (H) 09/08/2022   BUN 16 09/08/2022   CREATININE 0.71 (L) 09/08/2022   BILITOT 0.4 09/08/2022   ALKPHOS 121 09/08/2022   AST 21 09/08/2022   ALT 34 09/08/2022   PROT 7.5 09/08/2022   ALBUMIN 4.4 09/08/2022   CALCIUM 9.5 09/08/2022   EGFR 118  09/08/2022   Lab Results  Component Value Date   CHOL 190 09/08/2022   Lab Results  Component Value Date   HDL 52 09/08/2022   Lab Results  Component Value Date   LDLCALC 124 (H) 09/08/2022   Lab Results  Component Value Date   TRIG 77 09/08/2022   Lab Results  Component Value Date   CHOLHDL 3.7 09/08/2022   Lab Results  Component Value Date   HGBA1C 8.0 (A) 01/31/2023      Assessment & Plan:   Problem List Items Addressed This Visit       Endocrine   Type 2 diabetes mellitus with hyperlipidemia (HCC) - Primary   A1c 8.0 today Continue metformin 1000 mg twice daily Start Jardiance 10 mg daily, samples provided in the office Continue atorvastatin 10 mg daily for hyperlipidemia Patient counseled on low-carb modified diet Encouraged to engage in regular moderate exercises at least 150 minutes weekly Signs of hypoglycemia and treatment for hypoglycemia discussed Follow-up in 4 weeks  Addendum.  Jardiance not covered by the patient's insurance.  Farxiga 5 mg daily ordered, Jardiance discontinued.  The nurse will notify the patient      Relevant Medications   dapagliflozin propanediol (FARXIGA) 5 MG TABS tablet   Other Relevant Orders   POCT glycosylated hemoglobin (Hb A1C) (Completed)    Meds ordered this encounter  Medications   DISCONTD: empagliflozin (JARDIANCE) 10 MG TABS tablet    Sig: Take 1 tablet (10 mg total) by mouth daily before breakfast.    Dispense:  30 tablet    Refill:  2   dapagliflozin propanediol (FARXIGA) 5 MG TABS tablet    Sig:  Take 1 tablet (5 mg total) by mouth daily before breakfast.    Dispense:  30 tablet    Refill:  0    Follow-up: Return in about 4 weeks (around 02/28/2023) for DM.    Donell Beers, FNP

## 2023-01-31 NOTE — Patient Instructions (Addendum)
Goal for fasting blood sugar ranges from 80 to 120 and 2 hours after any meal or at bedtime should be between 130 to 170.   Signs and symptoms of hypoglycemia  What are the signs or symptoms? Mild Hunger. Sweating and feeling clammy. Feeling dizzy or light-headed. Being sleepy or having trouble sleeping. Feeling like you may vomit (nauseous). A fast heartbeat. A headache. Blurry vision. Mood changes, such as: Being grouchy. Feeling worried or nervous (anxious). Tingling or loss of feeling (numbness) around your mouth, lips, or tongue. Moderate Confusion and poor judgment. Behavior changes. Weakness. Uneven heartbeat. Trouble with moving (coordination). Very low Very low blood sugar (severe hypoglycemia) is a medical emergency. It can cause: Fainting. Seizures. Loss of consciousness (coma). Death.     Carry rapidly absorbed carbohydrate source at all times Treats low glucose less than 70 as per rule of 15's.  Give 15 g of rapidly absorbed carbohydrate i.e. half cup juice or 4 glucose tabs.  Recheck glucose level in 15 minutes.  Give another 15 g of carbohydrate if glucose still less than 70.  Repeat until glucose level is greater than 70.  Once glucose level returns to normal consider follow-up with a snack or meal.  Informed provider of hypoglycemia episode at next appointment.     1. Type 2 diabetes mellitus with hyperlipidemia (HCC) (Primary)  - empagliflozin (JARDIANCE) 10 MG TABS tablet; Take 1 tablet (10 mg total) by mouth daily before breakfast.  Dispense: 30 tablet; Refill: 2   Continue metformin 1000 mg twice daily     It is important that you exercise regularly at least 30 minutes 5 times a week as tolerated  Think about what you will eat, plan ahead. Choose " clean, green, fresh or frozen" over canned, processed or packaged foods which are more sugary, salty and fatty. 70 to 75% of food eaten should be vegetables and fruit. Three meals at set times with  snacks allowed between meals, but they must be fruit or vegetables. Aim to eat over a 12 hour period , example 7 am to 7 pm, and STOP after  your last meal of the day. Drink water,generally about 64 ounces per day, no other drink is as healthy. Fruit juice is best enjoyed in a healthy way, by EATING the fruit.  Thanks for choosing [facility] we consider it a privelige to serve you.

## 2023-02-01 ENCOUNTER — Telehealth: Payer: Self-pay

## 2023-02-01 MED ORDER — DAPAGLIFLOZIN PROPANEDIOL 5 MG PO TABS
5.0000 mg | ORAL_TABLET | Freq: Every day | ORAL | 0 refills | Status: DC
Start: 2023-02-01 — End: 2023-03-22

## 2023-02-01 NOTE — Addendum Note (Signed)
Addended by: Donell Beers on: 02/01/2023 08:19 AM   Modules accepted: Orders

## 2023-02-01 NOTE — Telephone Encounter (Signed)
Spoke to pt friend concerning new medication farxiga. Pt was advised to start this due to the jardanice not being covered. KH

## 2023-02-04 ENCOUNTER — Telehealth: Payer: Self-pay

## 2023-02-04 ENCOUNTER — Other Ambulatory Visit: Payer: Self-pay

## 2023-02-04 NOTE — Telephone Encounter (Signed)
Pharmacy Patient Advocate Encounter   Received notification from CoverMyMeds that prior authorization for [facility] is required/requested.   Insurance verification completed.   The patient is insured through [facility] [place] .   Per test claim: PA required; PA submitted to above mentioned insurance via CoverMyMeds Key/confirmation #/EOC [id number] Status is pending

## 2023-02-04 NOTE — Telephone Encounter (Signed)
Pharmacy Patient Advocate Encounter  Received notification from [facility] Medicaid that Prior Authorization for Adam Contreras has been APPROVED from 01/21/2023 to 02/04/2024   PA #/Case ID/Reference #:  [id number]

## 2023-02-07 ENCOUNTER — Telehealth: Payer: Self-pay

## 2023-02-07 NOTE — Telephone Encounter (Signed)
Copied from CRM [id number]. Topic: Clinical - Medication Question >> Feb 04, 2023  1:33 PM Donita Brooks wrote: Reason for CRM: provider said if the insurance doesn't cover Medicaid to give her a call, he calling to speak with provider- [phone]

## 2023-02-28 ENCOUNTER — Ambulatory Visit: Payer: Self-pay | Admitting: Nurse Practitioner

## 2023-03-22 ENCOUNTER — Encounter: Payer: Self-pay | Admitting: Nurse Practitioner

## 2023-03-22 ENCOUNTER — Ambulatory Visit (INDEPENDENT_AMBULATORY_CARE_PROVIDER_SITE_OTHER): Payer: Medicaid Other | Admitting: Nurse Practitioner

## 2023-03-22 DIAGNOSIS — E785 Hyperlipidemia, unspecified: Secondary | ICD-10-CM

## 2023-03-22 DIAGNOSIS — E1169 Type 2 diabetes mellitus with other specified complication: Secondary | ICD-10-CM | POA: Diagnosis not present

## 2023-03-22 MED ORDER — ATORVASTATIN CALCIUM 10 MG PO TABS
10.0000 mg | ORAL_TABLET | Freq: Every day | ORAL | 1 refills | Status: DC
Start: 2023-03-22 — End: 2023-08-29

## 2023-03-22 MED ORDER — DAPAGLIFLOZIN PROPANEDIOL 5 MG PO TABS
5.0000 mg | ORAL_TABLET | Freq: Every day | ORAL | 1 refills | Status: DC
Start: 2023-03-22 — End: 2023-05-23

## 2023-03-22 NOTE — Assessment & Plan Note (Addendum)
 Continue metformin  1000 mg twice daily Farxiga  5 mg daily refilled Continue atorvastatin  10 mg daily for hyperlipidemia, medication refilled Patient counseled on low-carb diet Encouraged to engage in regular moderate exercise as tolerated at least 150 minutes weekly Signs of hypoglycemia reviewed Follow-up in 2 months Checking BMP today Patient encouraged to contact the pharmacy or send a message to this office for medication refills when needed

## 2023-03-22 NOTE — Patient Instructions (Signed)
 Please consider getting pneumococcal and Tdap vaccine at local pharmacy.    Type 2 diabetes mellitus with hyperlipidemia (HCC)  - atorvastatin  (LIPITOR) 10 MG tablet; Take 1 tablet (10 mg total) by mouth daily.  Dispense: 90 tablet; Refill: 1 - dapagliflozin  propanediol (FARXIGA ) 5 MG TABS tablet; Take 1 tablet (5 mg total) by mouth daily before breakfast.  Dispense: 60 tablet; Refill: 1 - Basic Metabolic Panel    It is important that you exercise regularly at least 30 minutes 5 times a week as tolerated  Think about what you will eat, plan ahead. Choose  clean, green, fresh or frozen over canned, processed or packaged foods which are more sugary, salty and fatty. 70 to 75% of food eaten should be vegetables and fruit. Three meals at set times with snacks allowed between meals, but they must be fruit or vegetables. Aim to eat over a 12 hour period , example 7 am to 7 pm, and STOP after  your last meal of the day. Drink water,generally about 64 ounces per day, no other drink is as healthy. Fruit juice is best enjoyed in a healthy way, by EATING the fruit.  Thanks for choosing [facility] we consider it a privelige to serve you.

## 2023-03-22 NOTE — Progress Notes (Signed)
 Established Patient Office Visit  Subjective:  Patient ID: Adam Contreras, male    DOB: 10/15/1980  Age: 43 y.o. MRN: [id number]  CC:  Chief Complaint  Patient presents with   Diabetes    Follow up     HPI Adam Contreras is a 43 y.o. 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   Patient presents for follow-up for type 2 diabetes.  Currently on metformin  1000 mg twice daily, Farxiga  5 mg daily, stated that he ran out of Farxiga .  Patient denies any adverse reactions to Farxiga  and is on the current medication.  Has not been able to check his blood sugar due to his visual impairments.  He denies signs of hypoglycemia.  Takes atorvastatin  10 mg daily for hyperlipidemia  Encouraged to get Tdap vaccine and pneumococcal vaccine at the pharmacy  Interpretation services provided via [facility] iPad      Past Medical History:  Diagnosis Date   Blind left eye    Blind left eye 09/06/2014   Dyslipidemia, goal LDL below 70 10/25/2022   Glaucoma    Head injury    Tuberculosis    Uncontrolled type 2 diabetes mellitus with hyperglycemia (HCC) 10/25/2022   Uveitic glaucoma, right, severe stage 09/07/2022    History reviewed. No pertinent surgical history.  Family History  Family history unknown: Yes    Social History   Socioeconomic History   Marital status: Married    Spouse name: Not on file   Number of children: 2   Years of education: Not on file   Highest education level: Not on file  Occupational History   Not on file  Tobacco Use   Smoking status: Never   Smokeless tobacco: Not on file  Substance and Sexual Activity   Alcohol use: No    Alcohol/week: 0.0 standard drinks of alcohol   Drug use: Never   Sexual activity:  Not Currently  Other Topics Concern   Not on file  Social History Narrative   Lives with his room mate    Social Drivers of Health   Financial Resource Strain: Not on file  Food Insecurity: Not on file  Transportation Needs: Not on file  Physical Activity: Not on file  Stress: Not on file  Social Connections: Not on file  Intimate Partner Violence: Not on file    Outpatient Medications Prior to Visit  Medication Sig Dispense Refill   Blood Glucose Monitoring Suppl DEVI 1 each by Does not apply route in the morning, at noon, and at bedtime. May substitute to any manufacturer covered by patient's insurance. 1 each 0   brimonidine (ALPHAGAN) 0.2 % ophthalmic solution Place 1 drop into the right eye 2 (two) times daily.     metFORMIN  (GLUCOPHAGE -XR) 500 MG 24 hr tablet Take 2 tablets (1,000 mg total) by mouth 2 (two) times daily with a meal. 120 tablet 3   acetaZOLAMIDE ER (DIAMOX) 500 MG capsule Take by mouth. (Patient not taking: Reported on 03/22/2023)     atorvastatin  (LIPITOR) 10 MG tablet Take 1 tablet (10 mg total) by mouth daily. (Patient not taking: Reported on 03/22/2023) 90 tablet 1   dapagliflozin  propanediol (FARXIGA ) 5 MG TABS tablet Take 1 tablet (5 mg total) by mouth daily before breakfast. (Patient not taking: Reported on 03/22/2023) 30  tablet 0   No facility-administered medications prior to visit.    No Known Allergies  ROS Review of Systems  Constitutional:  Negative for appetite change, chills, fatigue and fever.  HENT:  Negative for congestion, postnasal drip, rhinorrhea and sneezing.   Respiratory:  Negative for cough, shortness of breath and wheezing.   Cardiovascular:  Negative for chest pain, palpitations and leg swelling.  Gastrointestinal:  Negative for abdominal pain, constipation, nausea and vomiting.  Genitourinary:  Negative for difficulty urinating, dysuria, flank pain and frequency.  Musculoskeletal:  Negative for arthralgias, back pain, joint swelling  and myalgias.  Skin:  Negative for color change, pallor, rash and wound.  Neurological:  Negative for dizziness, facial asymmetry, weakness, numbness and headaches.  Psychiatric/Behavioral:  Negative for behavioral problems, confusion, self-injury and suicidal ideas.       Objective:    Physical Exam Vitals and nursing note reviewed.  Constitutional:      General: He is not in acute distress.    Appearance: Normal appearance. He is not ill-appearing, toxic-appearing or diaphoretic.  HENT:     Mouth/Throat:     Mouth: Mucous membranes are moist.     Pharynx: Oropharynx is clear. No oropharyngeal exudate or posterior oropharyngeal erythema.  Cardiovascular:     Rate and Rhythm: Normal rate and regular rhythm.     Pulses: Normal pulses.     Heart sounds: Normal heart sounds. No murmur heard.    No friction rub. No gallop.  Pulmonary:     Effort: Pulmonary effort is normal. No respiratory distress.     Breath sounds: Normal breath sounds. No stridor. No wheezing, rhonchi or rales.  Chest:     Chest wall: No tenderness.  Abdominal:     General: There is no distension.     Palpations: Abdomen is soft.     Tenderness: There is no abdominal tenderness. There is no right CVA tenderness, left CVA tenderness or guarding.  Musculoskeletal:        General: No swelling, tenderness, deformity or signs of injury.     Right lower leg: No edema.     Left lower leg: No edema.  Skin:    General: Skin is warm and dry.     Capillary Refill: Capillary refill takes less than 2 seconds.     Coloration: Skin is not jaundiced or pale.     Findings: No bruising, erythema or lesion.  Neurological:     Mental Status: He is alert and oriented to person, place, and time.     Motor: No weakness.     Coordination: Coordination normal.     Gait: Gait normal.  Psychiatric:        Mood and Affect: Mood normal.        Behavior: Behavior normal.        Thought Content: Thought content normal.         Judgment: Judgment normal.     BP 105/69   Pulse 84   Temp (!) 97.2 F (36.2 C)   Wt 162 lb (73.5 kg)   SpO2 100%   BMI 25.37 kg/m  Wt Readings from Last 3 Encounters:  03/22/23 162 lb (73.5 kg)  01/31/23 166 lb (75.3 kg)  11/22/22 167 lb 3.2 oz (75.8 kg)    Lab Results  Component Value Date   TSH 1.980 09/08/2022   Lab Results  Component Value Date   WBC 4.5 09/08/2022   HGB 14.5 09/08/2022   HCT 44.9 09/08/2022  MCV 84 09/08/2022   PLT 244 09/08/2022   Lab Results  Component Value Date   NA 137 09/08/2022   K 3.8 09/08/2022   CO2 21 09/08/2022   GLUCOSE 279 (H) 09/08/2022   BUN 16 09/08/2022   CREATININE 0.71 (L) 09/08/2022   BILITOT 0.4 09/08/2022   ALKPHOS 121 09/08/2022   AST 21 09/08/2022   ALT 34 09/08/2022   PROT 7.5 09/08/2022   ALBUMIN 4.4 09/08/2022   CALCIUM  9.5 09/08/2022   EGFR 118 09/08/2022   Lab Results  Component Value Date   CHOL 190 09/08/2022   Lab Results  Component Value Date   HDL 52 09/08/2022   Lab Results  Component Value Date   LDLCALC 124 (H) 09/08/2022   Lab Results  Component Value Date   TRIG 77 09/08/2022   Lab Results  Component Value Date   CHOLHDL 3.7 09/08/2022   Lab Results  Component Value Date   HGBA1C 8.0 (A) 01/31/2023      Assessment & Plan:   Problem List Items Addressed This Visit       Endocrine   Type 2 diabetes mellitus with hyperlipidemia (HCC)   Continue metformin  1000 mg twice daily Farxiga  5 mg daily refilled Continue atorvastatin  10 mg daily for hyperlipidemia, medication refilled Patient counseled on low-carb diet Encouraged to engage in regular moderate exercise as tolerated at least 150 minutes weekly Signs of hypoglycemia reviewed Follow-up in 2 months Checking BMP today Patient encouraged to contact the pharmacy or send a message to this office for medication refills when needed      Relevant Medications   atorvastatin  (LIPITOR) 10 MG tablet   dapagliflozin   propanediol (FARXIGA ) 5 MG TABS tablet   Other Relevant Orders   Basic Metabolic Panel    Meds ordered this encounter  Medications   atorvastatin  (LIPITOR) 10 MG tablet    Sig: Take 1 tablet (10 mg total) by mouth daily.    Dispense:  90 tablet    Refill:  1   dapagliflozin  propanediol (FARXIGA ) 5 MG TABS tablet    Sig: Take 1 tablet (5 mg total) by mouth daily before breakfast.    Dispense:  60 tablet    Refill:  1    Follow-up: Return in about 2 months (around 05/20/2023).    Kaiyah Eber R Coner Gibbard, FNP

## 2023-03-23 LAB — BASIC METABOLIC PANEL
BUN/Creatinine Ratio: 27 — ABNORMAL HIGH (ref 9–20)
BUN: 17 mg/dL (ref 6–24)
CO2: 21 mmol/L (ref 20–29)
Calcium: 9.4 mg/dL (ref 8.7–10.2)
Chloride: 99 mmol/L (ref 96–106)
Creatinine, Ser: 0.64 mg/dL — ABNORMAL LOW (ref 0.76–1.27)
Glucose: 257 mg/dL — ABNORMAL HIGH (ref 70–99)
Potassium: 3.8 mmol/L (ref 3.5–5.2)
Sodium: 137 mmol/L (ref 134–144)
eGFR: 122 mL/min/{1.73_m2} (ref >59)

## 2023-05-20 ENCOUNTER — Ambulatory Visit: Payer: Self-pay | Admitting: Nurse Practitioner

## 2023-05-23 ENCOUNTER — Encounter: Payer: Self-pay | Admitting: Nurse Practitioner

## 2023-05-23 ENCOUNTER — Ambulatory Visit (INDEPENDENT_AMBULATORY_CARE_PROVIDER_SITE_OTHER): Payer: Self-pay | Admitting: Nurse Practitioner

## 2023-05-23 VITALS — BP 110/61 | HR 72 | Temp 97.3°F | Wt 156.0 lb

## 2023-05-23 DIAGNOSIS — E1169 Type 2 diabetes mellitus with other specified complication: Secondary | ICD-10-CM | POA: Diagnosis not present

## 2023-05-23 DIAGNOSIS — E785 Hyperlipidemia, unspecified: Secondary | ICD-10-CM

## 2023-05-23 LAB — POCT GLYCOSYLATED HEMOGLOBIN (HGB A1C): Hemoglobin A1C: 7 % — AB (ref 4.0–5.6)

## 2023-05-23 MED ORDER — METFORMIN HCL ER 500 MG PO TB24: 1000.0000 mg | ORAL_TABLET | Freq: Two times a day (BID) | ORAL | 7 refills | Status: DC

## 2023-05-23 MED ORDER — DAPAGLIFLOZIN PROPANEDIOL 10 MG PO TABS: 10.0000 mg | ORAL_TABLET | Freq: Every day | ORAL | 1 refills | Status: DC

## 2023-05-23 NOTE — Progress Notes (Signed)
 Established Patient Office Visit  Subjective:  Patient ID: Adam Contreras, male    DOB: Jul 22, 1980  Age: 43 y.o. MRN: [id number]  CC:  Chief Complaint  Patient presents with   Diabetes    HPI Adam Contreras is a 43 y.o. 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  Patient presents for follow-up for his chronic medical conditions  Type 2 diabetes.  Currently on metformin 1000 mg twice daily, Farxiga 5 mg daily, stated that he ran out of metformin 2 weeks ago.  Has not been checking his blood sugar due to his visual impairment.  Will order a talking glucose meter.  Patient denies signs of hypoglycemia.  Takes atorvastatin 10 mg daily for hyperlipidemia  Interpretation provided by medical interpreter  Past Medical History:  Diagnosis Date   Blind left eye    Blind left eye 09/06/2014   Dyslipidemia, goal LDL below 70 10/25/2022   Glaucoma    Head injury    Tuberculosis    Uncontrolled type 2 diabetes mellitus with hyperglycemia (HCC) 10/25/2022   Uveitic glaucoma, right, severe stage 09/07/2022    History reviewed. No pertinent surgical history.  Family History  Family history unknown: Yes    Social History   Socioeconomic History   Marital status: Married    Spouse name: Not on file   Number of children: 2   Years of education: Not on file   Highest education level: Not on file  Occupational History   Not on file  Tobacco Use   Smoking status: Never   Smokeless tobacco: Not on file  Substance and Sexual Activity   Alcohol use: No    Alcohol/week: 0.0 standard drinks of alcohol   Drug use: Never   Sexual activity: Not Currently  Other Topics Concern   Not on file  Social History Narrative   Lives with his room mate     Social Drivers of Health   Financial Resource Strain: Not on file  Food Insecurity: Not on file  Transportation Needs: Not on file  Physical Activity: Not on file  Stress: Not on file  Social Connections: Not on file  Intimate Partner Violence: Not on file    Outpatient Medications Prior to Visit  Medication Sig Dispense Refill   atorvastatin (LIPITOR) 10 MG tablet Take 1 tablet (10 mg total) by mouth daily. 90 tablet 1   Blood Glucose Monitoring Suppl DEVI 1 each by Does not apply route in the morning, at noon, and at bedtime. May substitute to any manufacturer covered by patient's insurance. 1 each 0   brimonidine (ALPHAGAN) 0.2 % ophthalmic solution Place 1 drop into the right eye 2 (two) times daily.     prednisoLONE acetate (PRED FORTE) 1 % ophthalmic suspension Administer 1 drop to the right eye four (4) times a day.     dapagliflozin propanediol (FARXIGA) 5 MG TABS tablet Take 1 tablet (5 mg total) by mouth daily before breakfast. 60 tablet 1   acetaZOLAMIDE ER (DIAMOX) 500 MG capsule Take by mouth. (Patient not taking: Reported on 03/22/2023)     dorzolamide-timolol (COSOPT) 2-0.5 % ophthalmic solution Place 1 drop into the right eye 2 (two) times daily.     metFORMIN (GLUCOPHAGE-XR) 500 MG 24 hr tablet Take 2 tablets (1,000 mg total) by mouth 2 (  two) times daily with a meal. (Patient not taking: Reported on 05/23/2023) 120 tablet 3   No facility-administered medications prior to visit.    No Known Allergies  ROS Review of Systems  Constitutional:  Negative for appetite change, chills, fatigue and fever.  HENT:  Negative for congestion, postnasal drip, rhinorrhea and sneezing.   Eyes:  Positive for visual disturbance.  Respiratory:  Negative for cough, shortness of breath and wheezing.   Cardiovascular:  Negative for chest pain, palpitations and leg swelling.  Gastrointestinal:  Negative for abdominal pain, constipation, nausea and vomiting.  Genitourinary:  Negative for  difficulty urinating, dysuria, flank pain and frequency.  Musculoskeletal:  Negative for arthralgias, back pain, joint swelling and myalgias.  Skin:  Negative for color change, pallor, rash and wound.  Neurological:  Negative for dizziness, facial asymmetry, weakness, numbness and headaches.  Psychiatric/Behavioral:  Negative for behavioral problems, confusion, self-injury and suicidal ideas.       Objective:    Physical Exam Vitals and nursing note reviewed.  Constitutional:      General: He is not in acute distress.    Appearance: Normal appearance. He is not ill-appearing, toxic-appearing or diaphoretic.  HENT:     Mouth/Throat:     Mouth: Mucous membranes are moist.     Pharynx: Oropharynx is clear. No oropharyngeal exudate or posterior oropharyngeal erythema.  Eyes:     Comments:  Blind in the left eye  Cardiovascular:     Rate and Rhythm: Normal rate and regular rhythm.     Pulses: Normal pulses.     Heart sounds: Normal heart sounds. No murmur heard.    No friction rub. No gallop.  Pulmonary:     Effort: Pulmonary effort is normal. No respiratory distress.     Breath sounds: Normal breath sounds. No stridor. No wheezing, rhonchi or rales.  Chest:     Chest wall: No tenderness.  Abdominal:     General: There is no distension.     Palpations: Abdomen is soft.     Tenderness: There is no abdominal tenderness. There is no right CVA tenderness, left CVA tenderness or guarding.  Musculoskeletal:        General: No swelling, tenderness, deformity or signs of injury.     Right lower leg: No edema.     Left lower leg: No edema.  Skin:    General: Skin is warm and dry.     Capillary Refill: Capillary refill takes less than 2 seconds.     Coloration: Skin is not jaundiced or pale.     Findings: No bruising, erythema or lesion.  Neurological:     Mental Status: He is alert and oriented to person, place, and time.     Motor: No weakness.     Coordination: Coordination normal.      Gait: Gait normal.  Psychiatric:        Mood and Affect: Mood normal.        Behavior: Behavior normal.        Thought Content: Thought content normal.        Judgment: Judgment normal.     BP 110/61   Pulse 72   Temp (!) 97.3 F (36.3 C)   Wt 156 lb (70.8 kg)   SpO2 100%   BMI 24.43 kg/m  Wt Readings from Last 3 Encounters:  05/23/23 156 lb (70.8 kg)  03/22/23 162 lb (73.5 kg)  01/31/23 166 lb (75.3 kg)    Lab Results  Component  Value Date   TSH 1.980 09/08/2022   Lab Results  Component Value Date   WBC 4.5 09/08/2022   HGB 14.5 09/08/2022   HCT 44.9 09/08/2022   MCV 84 09/08/2022   PLT 244 09/08/2022   Lab Results  Component Value Date   NA 137 03/22/2023   K 3.8 03/22/2023   CO2 21 03/22/2023   GLUCOSE 257 (H) 03/22/2023   BUN 17 03/22/2023   CREATININE 0.64 (L) 03/22/2023   BILITOT 0.4 09/08/2022   ALKPHOS 121 09/08/2022   AST 21 09/08/2022   ALT 34 09/08/2022   PROT 7.5 09/08/2022   ALBUMIN 4.4 09/08/2022   CALCIUM 9.4 03/22/2023   EGFR 122 03/22/2023   Lab Results  Component Value Date   CHOL 190 09/08/2022   Lab Results  Component Value Date   HDL 52 09/08/2022   Lab Results  Component Value Date   LDLCALC 124 (H) 09/08/2022   Lab Results  Component Value Date   TRIG 77 09/08/2022   Lab Results  Component Value Date   CHOLHDL 3.7 09/08/2022   Lab Results  Component Value Date   HGBA1C 7.0 (A) 05/23/2023      Assessment & Plan:   Problem List Items Addressed This Visit       Endocrine   Type 2 diabetes mellitus with hyperlipidemia (HCC) - Primary   Lab Results  Component Value Date   HGBA1C 7.0 (A) 05/23/2023   Lab Results  Component Value Date   CHOL 190 09/08/2022   HDL 52 09/08/2022   LDLCALC 124 (H) 09/08/2022   LDLDIRECT 62 11/22/2022   TRIG 77 09/08/2022   CHOLHDL 3.7 09/08/2022       A1c improved from 8.0  3 months ago to 7.0 Metformin 1000 mg twice daily refilled Increase Farxiga to 10 mg  daily Patient counseled on low-carb diet, signs of hypoglycemia reviewed He was encouraged to take his medications with food to prevent hypoglycemia Talking glucometer ordered, CBG goals discussed Encouraged to ask for medication refills before running out Continue atorvastatin 10 mg daily for hyperlipidemia Checking lipid panel      Relevant Medications   metFORMIN (GLUCOPHAGE-XR) 500 MG 24 hr tablet   dapagliflozin propanediol (FARXIGA) 10 MG TABS tablet   Other Relevant Orders   POCT glycosylated hemoglobin (Hb A1C) (Completed)   Lipid panel    Meds ordered this encounter  Medications   metFORMIN (GLUCOPHAGE-XR) 500 MG 24 hr tablet    Sig: Take 2 tablets (1,000 mg total) by mouth 2 (two) times daily with a meal.    Dispense:  120 tablet    Refill:  7   dapagliflozin propanediol (FARXIGA) 10 MG TABS tablet    Sig: Take 1 tablet (10 mg total) by mouth daily before breakfast.    Dispense:  90 tablet    Refill:  1    Follow-up: Return in about 3 months (around 08/22/2023) for DM.    Donell Beers, FNP

## 2023-05-23 NOTE — Patient Instructions (Addendum)
 Goal for fasting blood sugar ranges from 80 to 120 and 2 hours after any meal or at bedtime should be between 130 to 170.   Marland Kitchen Uncontrolled type 2 diabetes mellitus with hyperglycemia (HCC)  - metFORMIN (GLUCOPHAGE-XR) 500 MG 24 hr tablet; Take 2 tablets (1,000 mg total) by mouth 2 (two) times daily with a meal.  Dispense: 120 tablet; Refill: 7 - dapagliflozin propanediol (FARXIGA) 10 MG TABS tablet; Take 1 tablet (10 mg total) by mouth daily before breakfast.  Dispense: 90 tablet; Refill: 1   . Dyslipidemia, goal LDL below 70  - Lipid panel    It is important that you exercise regularly at least 30 minutes 5 times a week as tolerated  Think about what you will eat, plan ahead. Choose " clean, green, fresh or frozen" over canned, processed or packaged foods which are more sugary, salty and fatty. 70 to 75% of food eaten should be vegetables and fruit. Three meals at set times with snacks allowed between meals, but they must be fruit or vegetables. Aim to eat over a 12 hour period , example 7 am to 7 pm, and STOP after  your last meal of the day. Drink water,generally about 64 ounces per day, no other drink is as healthy. Fruit juice is best enjoyed in a healthy way, by EATING the fruit.  Thanks for choosing [facility] we consider it a privelige to serve you.

## 2023-05-23 NOTE — Assessment & Plan Note (Signed)
 Lab Results  Component Value Date   CHOL 190 09/08/2022   HDL 52 09/08/2022   LDLCALC 124 (H) 09/08/2022   LDLDIRECT 62 11/22/2022   TRIG 77 09/08/2022   CHOLHDL 3.7 09/08/2022  Check lipid panel Continue atorvastatin 10 mg daily

## 2023-05-23 NOTE — Assessment & Plan Note (Addendum)
 Lab Results  Component Value Date   HGBA1C 7.0 (A) 05/23/2023   Lab Results  Component Value Date   CHOL 190 09/08/2022   HDL 52 09/08/2022   LDLCALC 124 (H) 09/08/2022   LDLDIRECT 62 11/22/2022   TRIG 77 09/08/2022   CHOLHDL 3.7 09/08/2022       A1c improved from 8.0  3 months ago to 7.0 Metformin 1000 mg twice daily refilled Increase Farxiga to 10 mg daily Patient counseled on low-carb diet, signs of hypoglycemia reviewed He was encouraged to take his medications with food to prevent hypoglycemia Talking glucometer ordered, CBG goals discussed Encouraged to ask for medication refills before running out Continue atorvastatin 10 mg daily for hyperlipidemia Checking lipid panel

## 2023-05-24 LAB — LIPID PANEL
Chol/HDL Ratio: 2.5 ratio (ref 0.0–5.0)
Cholesterol, Total: 114 mg/dL (ref 100–199)
HDL: 45 mg/dL (ref >39)
LDL Chol Calc (NIH): 57 mg/dL (ref 0–99)
Triglycerides: 53 mg/dL (ref 0–149)
VLDL Cholesterol Cal: 12 mg/dL (ref 5–40)

## 2023-06-07 ENCOUNTER — Other Ambulatory Visit: Payer: Self-pay

## 2023-06-07 DIAGNOSIS — E1165 Type 2 diabetes mellitus with hyperglycemia: Secondary | ICD-10-CM

## 2023-06-07 DIAGNOSIS — H5440 Blindness, one eye, unspecified eye: Secondary | ICD-10-CM

## 2023-06-08 ENCOUNTER — Telehealth: Payer: Self-pay | Admitting: *Deleted

## 2023-06-08 NOTE — Progress Notes (Signed)
 Care Guide Pharmacy Note  06/08/2023 Name: Adam Contreras MRN: [id number] DOB: Mar 10, 1980  Referred By: Paseda, Folashade R, FNP Reason for referral: Complex Care Management (Initial outreach to schedule referral with PharmD )   Adam Contreras is a 43 y.o. year old male who is a primary care patient of Paseda, Folashade R, FNP.  Adam Contreras was referred to the pharmacist for assistance related to: DMII  Successful contact was made with the patient to discuss pharmacy services including being ready for the pharmacist to call at least 5 minutes before the scheduled appointment time and to have medication bottles and any blood pressure readings ready for review. The patient agreed to meet with the pharmacist via telephone visit on (date/time). 4/29  [facility]  [facility], [facility] Care Guide  Direct Dial: [phone]  Fax [phone]

## 2023-06-14 ENCOUNTER — Other Ambulatory Visit: Payer: Self-pay

## 2023-06-14 ENCOUNTER — Telehealth: Payer: Self-pay

## 2023-06-14 DIAGNOSIS — Z79899 Other long term (current) drug therapy: Secondary | ICD-10-CM

## 2023-06-14 NOTE — Progress Notes (Addendum)
   06/14/2023  Patient ID: Adam Contreras, male   DOB: [birth year], 43 y.o.   MRN: [id number]  Care Coordination Call Referral placed for access for glucometer in visually impaired patient.   I called [facility] Medicaid and was informed that all diabetic testing supplies/equipment require "medically necessary" to be indicated on the prescription/order. Chart review shows no documented hypoglycemic events in the EMR.  EasyMax is on formulary, but EasyMax Voice is not. They could not confirm formulary options for visually impaired patients and recommended submitting a PA for the BGM or CGM, if appropriate.  I spoke with another representative regarding the same issue and was told the following information. Items costing <$500 per line item do not require prior authorization. Items >$500 require a PA and documentation of medical necessity.  Spoke with another representative who stated their preferred devices Dexcom G6 and G7 sensors, Freestyle Libre 2 and 3 readers and sensors but was not listed if this was specifically for coverage of disable patients.   I was not able to get specific information on formulary for diabetic testing supplies in patients who are visually impaired. Was on the telephone with insurance for 57 minutes.    Reference #: [id number] for [facility] representative 1   Reference #: [id number] for [facility] representative 2     Thank you for allowing pharmacy to be a part of this patient's care. Alexandria Angel, PharmD Clinical Pharmacist Cell: [phone]

## 2023-06-14 NOTE — Progress Notes (Signed)
   06/14/2023  Patient ID: Adam Contreras, male   DOB: Oct 02, 1980, 43 y.o.   MRN: [id number]  Attempted to contact patient for medication management/review. Left HIPAA compliant message for patient to return my call at their convenience.   First attempt for patient outreach. Will follow up reschedule appointment.   Thank you for allowing pharmacy to be a part of this patient's care.  Alexandria Angel, PharmD Clinical Pharmacist Cell: [phone]

## 2023-06-15 ENCOUNTER — Telehealth: Payer: Self-pay | Admitting: *Deleted

## 2023-06-15 NOTE — Progress Notes (Signed)
 Complex Care Management Care Guide Note  06/15/2023 Name: Tryton Herting MRN: [id number] DOB: 08/01/1980  Brand Alemany is a 43 y.o. year old male who is a primary care patient of Paseda, Folashade R, FNP and is actively engaged with the care management team. I reached out to [facility] by phone today to assist with re-scheduling  with the Pharmacist.  Follow up plan: Unsuccessful telephone outreach attempt made.   Barnie Bora  [facility]  Value[facility]Based [facility], [facility] Guide  Direct Dial: [phone]  Fax [phone]

## 2023-06-16 NOTE — Progress Notes (Signed)
 No answer lvm for pt to call to be scheduled with the pharmacy. Kh

## 2023-06-22 NOTE — Progress Notes (Signed)
 Complex Care Management Care Guide Note  06/22/2023 Name: Adam Contreras MRN: [id number] DOB: 02-24-1980  Adam Contreras is a 43 y.o. year old male who is a primary care patient of Paseda, Folashade R, FNP and is actively engaged with the care management team. I reached out to [facility] by phone today to assist with re-scheduling  with the Pharmacist. Using [employer] ID# [phone]  Follow up plan: Unsuccessful telephone outreach attempt made. A HIPAA compliant phone message was left for the patient providing contact information and requesting a return call.No further outreach attempts will be made at this time. We have been unable to contact the patient to reschedule for complex care management services.   Barnie Bora  [facility]  Value-Based Care Institute, [facility] Guide  Direct Dial: [phone]  Fax [phone]

## 2023-08-29 ENCOUNTER — Encounter: Payer: Self-pay | Admitting: Nurse Practitioner

## 2023-08-29 ENCOUNTER — Ambulatory Visit (INDEPENDENT_AMBULATORY_CARE_PROVIDER_SITE_OTHER): Payer: Self-pay | Admitting: Nurse Practitioner

## 2023-08-29 VITALS — BP 102/60 | HR 73 | Wt 152.0 lb

## 2023-08-29 DIAGNOSIS — T7840XA Allergy, unspecified, initial encounter: Secondary | ICD-10-CM | POA: Diagnosis not present

## 2023-08-29 DIAGNOSIS — H209 Unspecified iridocyclitis: Secondary | ICD-10-CM

## 2023-08-29 DIAGNOSIS — H4041X3 Glaucoma secondary to eye inflammation, right eye, severe stage: Secondary | ICD-10-CM

## 2023-08-29 DIAGNOSIS — E1169 Type 2 diabetes mellitus with other specified complication: Secondary | ICD-10-CM | POA: Diagnosis not present

## 2023-08-29 DIAGNOSIS — E785 Hyperlipidemia, unspecified: Secondary | ICD-10-CM | POA: Diagnosis not present

## 2023-08-29 DIAGNOSIS — J309 Allergic rhinitis, unspecified: Secondary | ICD-10-CM | POA: Insufficient documentation

## 2023-08-29 LAB — POCT GLYCOSYLATED HEMOGLOBIN (HGB A1C): Hemoglobin A1C: 6.2 % — AB (ref 4.0–5.6)

## 2023-08-29 MED ORDER — ATORVASTATIN CALCIUM 10 MG PO TABS: 10.0000 mg | ORAL_TABLET | Freq: Every day | ORAL | 1 refills | Status: DC

## 2023-08-29 MED ORDER — FLUTICASONE PROPIONATE 50 MCG/ACT NA SUSP: 2.0000 | Freq: Every day | NASAL | 6 refills | Status: AC

## 2023-08-29 MED ORDER — DAPAGLIFLOZIN PROPANEDIOL 10 MG PO TABS
10.0000 mg | ORAL_TABLET | Freq: Every day | ORAL | 1 refills | Status: DC
Start: 2023-08-29 — End: 2023-12-30

## 2023-08-29 MED ORDER — METFORMIN HCL ER 500 MG PO TB24
1000.0000 mg | ORAL_TABLET | Freq: Two times a day (BID) | ORAL | 7 refills | Status: DC
Start: 2023-08-29 — End: 2023-12-30

## 2023-08-29 NOTE — Progress Notes (Signed)
 Established Patient Office Visit  Subjective:  Patient ID: Adam Contreras, male    DOB: 1980-07-29  Age: 43 y.o. MRN: [id number]  CC:  Chief Complaint  Patient presents with   Diabetes    HPI Adam Contreras is a 43 y.o. 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  Patient presents for follow-up for his chronic medical conditions Interpretation services provided by medical interpreter, he is accompanied by his friend Patient denies any adverse reactions to current medications Has upcoming appointment with ophthalmologist this week  Allergies patient complains of sneezing, stuffy nose, runny nose.  Currently not on any medication.      Past Medical History:  Diagnosis Date   Blind left eye    Blind left eye 09/06/2014   Dyslipidemia, goal LDL below 70 10/25/2022   Glaucoma    Head injury    Tuberculosis    Uncontrolled type 2 diabetes mellitus with hyperglycemia (HCC) 10/25/2022   Uveitic glaucoma, right, severe stage 09/07/2022    History reviewed. No pertinent surgical history.  Family History  Family history unknown: Yes    Social History   Socioeconomic History   Marital status: Married    Spouse name: Not on file   Number of children: 2   Years of education: Not on file   Highest education level: Not on file  Occupational History   Not on file  Tobacco Use   Smoking status: Never   Smokeless tobacco: Not on file  Substance and Sexual Activity   Alcohol use: No    Alcohol/week: 0.0 standard drinks of alcohol   Drug use: Never   Sexual activity: Not Currently  Other Topics Concern   Not on file  Social History Narrative   Lives with his room mate    Social Drivers of Health   Financial Resource Strain:  Not on file  Food Insecurity: Not on file  Transportation Needs: Not on file  Physical Activity: Not on file  Stress: Not on file  Social Connections: Not on file  Intimate Partner Violence: Not on file    Outpatient Medications Prior to Visit  Medication Sig Dispense Refill   atorvastatin  (LIPITOR) 10 MG tablet Take 1 tablet (10 mg total) by mouth daily. 90 tablet 1   dapagliflozin  propanediol (FARXIGA ) 10 MG TABS tablet Take 1 tablet (10 mg total) by mouth daily before breakfast. 90 tablet 1   metFORMIN  (GLUCOPHAGE -XR) 500 MG 24 hr tablet Take 2 tablets (1,000 mg total) by mouth 2 (two) times daily with a meal. 120 tablet 7   acetaZOLAMIDE ER (DIAMOX) 500 MG capsule Take by mouth. (Patient not taking: Reported on 08/29/2023)     Blood Glucose Monitoring Suppl DEVI 1 each by Does not apply route in the morning, at noon, and at bedtime. May substitute to any manufacturer covered by patient's insurance. (Patient not taking: Reported on 08/29/2023) 1 each 0   brimonidine (ALPHAGAN) 0.2 % ophthalmic solution Place 1 drop into the right eye 2 (two) times daily.     dorzolamide-timolol (COSOPT) 2-0.5 % ophthalmic solution Place 1 drop into the right eye 2 (two) times daily.     prednisoLONE acetate (PRED FORTE) 1 % ophthalmic suspension Administer 1 drop to the right eye four (4) times a day. (Patient not taking: Reported on 08/29/2023)  No facility-administered medications prior to visit.    No Known Allergies  ROS Review of Systems  Constitutional:  Negative for activity change, appetite change, chills and diaphoresis.  HENT:  Positive for congestion, rhinorrhea and sneezing. Negative for dental problem, drooling and ear discharge.   Eyes:  Positive for visual disturbance. Negative for pain and redness.  Respiratory:  Negative for apnea, cough, choking, chest tightness, shortness of breath and wheezing.   Cardiovascular: Negative.  Negative for chest pain, palpitations and leg swelling.   Gastrointestinal:  Negative for abdominal distention, abdominal pain, anal bleeding, blood in stool, constipation, diarrhea and vomiting.  Endocrine: Negative for polydipsia, polyphagia and polyuria.  Genitourinary:  Negative for difficulty urinating, flank pain, frequency and genital sores.  Musculoskeletal: Negative.  Negative for arthralgias, back pain, gait problem and joint swelling.  Skin:  Negative for color change, pallor and rash.  Neurological:  Negative for facial asymmetry, light-headedness, numbness and headaches.  Psychiatric/Behavioral:  Negative for agitation, behavioral problems, confusion, hallucinations, self-injury, sleep disturbance and suicidal ideas.       Objective:    Physical Exam Vitals and nursing note reviewed.  Constitutional:      General: He is not in acute distress.    Appearance: Normal appearance. He is not ill-appearing, toxic-appearing or diaphoretic.  HENT:     Nose: Congestion present. No rhinorrhea.     Mouth/Throat:     Pharynx: No oropharyngeal exudate or posterior oropharyngeal erythema.  Eyes:     Comments: Blindness left eye  Cardiovascular:     Rate and Rhythm: Normal rate and regular rhythm.     Pulses: Normal pulses.     Heart sounds: Normal heart sounds. No murmur heard.    No friction rub. No gallop.  Pulmonary:     Effort: Pulmonary effort is normal. No respiratory distress.     Breath sounds: Normal breath sounds. No stridor. No wheezing, rhonchi or rales.  Chest:     Chest wall: No tenderness.  Abdominal:     General: There is no distension.     Palpations: Abdomen is soft.     Tenderness: There is no abdominal tenderness. There is no right CVA tenderness, left CVA tenderness or guarding.  Musculoskeletal:        General: No swelling, tenderness, deformity or signs of injury.     Right lower leg: No edema.     Left lower leg: No edema.  Skin:    General: Skin is warm and dry.     Capillary Refill: Capillary refill takes  less than 2 seconds.     Coloration: Skin is not jaundiced or pale.     Findings: No bruising, erythema or lesion.  Neurological:     Mental Status: He is alert and oriented to person, place, and time.     Motor: No weakness.     Coordination: Coordination normal.     Gait: Gait normal.  Psychiatric:        Mood and Affect: Mood normal.        Behavior: Behavior normal.        Thought Content: Thought content normal.        Judgment: Judgment normal.     BP 102/60   Pulse 73   Wt 152 lb (68.9 kg)   SpO2 100%   BMI 23.81 kg/m  Wt Readings from Last 3 Encounters:  08/29/23 152 lb (68.9 kg)  05/23/23 156 lb (70.8 kg)  03/22/23 162 lb (73.5 kg)  Lab Results  Component Value Date   TSH 1.980 09/08/2022   Lab Results  Component Value Date   WBC 4.5 09/08/2022   HGB 14.5 09/08/2022   HCT 44.9 09/08/2022   MCV 84 09/08/2022   PLT 244 09/08/2022   Lab Results  Component Value Date   NA 137 03/22/2023   K 3.8 03/22/2023   CO2 21 03/22/2023   GLUCOSE 257 (H) 03/22/2023   BUN 17 03/22/2023   CREATININE 0.64 (L) 03/22/2023   BILITOT 0.4 09/08/2022   ALKPHOS 121 09/08/2022   AST 21 09/08/2022   ALT 34 09/08/2022   PROT 7.5 09/08/2022   ALBUMIN 4.4 09/08/2022   CALCIUM  9.4 03/22/2023   EGFR 122 03/22/2023   Lab Results  Component Value Date   CHOL 114 05/23/2023   Lab Results  Component Value Date   HDL 45 05/23/2023   Lab Results  Component Value Date   LDLCALC 57 05/23/2023   Lab Results  Component Value Date   TRIG 53 05/23/2023   Lab Results  Component Value Date   CHOLHDL 2.5 05/23/2023   Lab Results  Component Value Date   HGBA1C 6.2 (A) 08/29/2023      Assessment & Plan:   Problem List Items Addressed This Visit       Endocrine   Type 2 diabetes mellitus with hyperlipidemia (HCC) - Primary   Lab Results  Component Value Date   HGBA1C 6.2 (A) 08/29/2023   Lab Results  Component Value Date   CHOL 114 05/23/2023   HDL 45  05/23/2023   LDLCALC 57 05/23/2023   LDLDIRECT 62 11/22/2022   TRIG 53 05/23/2023   CHOLHDL 2.5 05/23/2023    Well-controlled on Farxiga  10 mg daily, metformin  1000 mg twice daily Patient denies hypoglycemia Continue current medications, patient counseled on low-carb diet Follow-up in 4 months  Takes atorvastatin  10 mg daily for hyperlipidemia       Relevant Medications   atorvastatin  (LIPITOR) 10 MG tablet   dapagliflozin  propanediol (FARXIGA ) 10 MG TABS tablet   metFORMIN  (GLUCOPHAGE -XR) 500 MG 24 hr tablet   Other Relevant Orders   POCT glycosylated hemoglobin (Hb A1C) (Completed)   Basic Metabolic Panel     Other   Uveitic glaucoma, right, severe stage   Followed by ophthalmologist Currently on hotspot 2-0.5% ophthalmic solution 1 drop twice daily Continue current medication and maintain close follow-up with ophthalmologist      Allergies   Start Flonase  nasal spray 2 sprays into both nostrils daily Avoid allergens      Relevant Medications   fluticasone  (FLONASE ) 50 MCG/ACT nasal spray    Meds ordered this encounter  Medications   fluticasone  (FLONASE ) 50 MCG/ACT nasal spray    Sig: Place 2 sprays into both nostrils daily.    Dispense:  16 g    Refill:  6   atorvastatin  (LIPITOR) 10 MG tablet    Sig: Take 1 tablet (10 mg total) by mouth daily.    Dispense:  90 tablet    Refill:  1   dapagliflozin  propanediol (FARXIGA ) 10 MG TABS tablet    Sig: Take 1 tablet (10 mg total) by mouth daily before breakfast.    Dispense:  90 tablet    Refill:  1   metFORMIN  (GLUCOPHAGE -XR) 500 MG 24 hr tablet    Sig: Take 2 tablets (1,000 mg total) by mouth 2 (two) times daily with a meal.    Dispense:  120 tablet    Refill:  7    Follow-up: Return in about 4 months (around 12/30/2023) for CPE.    Alioune Hodgkin R Abegail Kloeppel, FNP

## 2023-08-29 NOTE — Assessment & Plan Note (Signed)
 Start Flonase  nasal spray 2 sprays into both nostrils daily Avoid allergens

## 2023-08-29 NOTE — Patient Instructions (Signed)
 Goal for fasting blood sugar ranges from 80 to 120 and 2 hours after any meal or at bedtime should be between 130 to 170.     Type 2 diabetes mellitus with hyperlipidemia (HCC) (Primary)  - POCT glycosylated hemoglobin (Hb A1C) - Basic Metabolic Panel - atorvastatin  (LIPITOR) 10 MG tablet; Take 1 tablet (10 mg total) by mouth daily for high cholesterol   - dapagliflozin  propanediol (FARXIGA ) 10 MG TABS tablet; Take 1 tablet (10 mg total) by mouth daily before breakfast for diabetes   Dispense: 90 tablet; Refill: 1 - metFORMIN  (GLUCOPHAGE -XR) 500 MG 24 hr tablet; Take 2 tablets (1,000 mg total) by mouth 2 (two) times daily with a meal for diabetes .  Dispense: 120 tablet; Refill: 7   Allergy, initial encounter  - fluticasone  (FLONASE ) 50 MCG/ACT nasal spray; Place 2 sprays into both nostrils daily.  Dispense: 16 g; Refill: 6    It is important that you exercise regularly at least 30 minutes 5 times a week as tolerated  Think about what you will eat, plan ahead. Choose  clean, green, fresh or frozen over canned, processed or packaged foods which are more sugary, salty and fatty. 70 to 75% of food eaten should be vegetables and fruit. Three meals at set times with snacks allowed between meals, but they must be fruit or vegetables. Aim to eat over a 12 hour period , example 7 am to 7 pm, and STOP after  your last meal of the day. Drink water,generally about 64 ounces per day, no other drink is as healthy. Fruit juice is best enjoyed in a healthy way, by EATING the fruit.  Thanks for choosing [facility] we consider it a privelige to serve you.

## 2023-08-29 NOTE — Assessment & Plan Note (Signed)
 Followed by ophthalmologist Currently on hotspot 2-0.5% ophthalmic solution 1 drop twice daily Continue current medication and maintain close follow-up with ophthalmologist

## 2023-08-29 NOTE — Assessment & Plan Note (Addendum)
 Lab Results  Component Value Date   HGBA1C 6.2 (A) 08/29/2023   Lab Results  Component Value Date   CHOL 114 05/23/2023   HDL 45 05/23/2023   LDLCALC 57 05/23/2023   LDLDIRECT 62 11/22/2022   TRIG 53 05/23/2023   CHOLHDL 2.5 05/23/2023    Well-controlled on Farxiga  10 mg daily, metformin  1000 mg twice daily Patient denies hypoglycemia Continue current medications, patient counseled on low-carb diet Follow-up in 4 months  Takes atorvastatin  10 mg daily for hyperlipidemia

## 2023-08-30 ENCOUNTER — Ambulatory Visit: Payer: Self-pay | Admitting: Nurse Practitioner

## 2023-08-30 LAB — BASIC METABOLIC PANEL WITH GFR
BUN/Creatinine Ratio: 22 — ABNORMAL HIGH (ref 9–20)
BUN: 14 mg/dL (ref 6–24)
CO2: 21 mmol/L (ref 20–29)
Calcium: 9.6 mg/dL (ref 8.7–10.2)
Chloride: 103 mmol/L (ref 96–106)
Creatinine, Ser: 0.65 mg/dL — ABNORMAL LOW (ref 0.76–1.27)
Glucose: 85 mg/dL (ref 70–99)
Potassium: 4.3 mmol/L (ref 3.5–5.2)
Sodium: 140 mmol/L (ref 134–144)
eGFR: 121 mL/min/1.73 (ref >59)

## 2023-09-07 ENCOUNTER — Ambulatory Visit: Payer: Self-pay | Admitting: Nurse Practitioner

## 2023-12-30 ENCOUNTER — Ambulatory Visit
Admission: RE | Admit: 2023-12-30 | Discharge: 2023-12-30 | Disposition: A | Discharge status: Home / Self Care | Source: Ambulatory Visit | Attending: Infectious Diseases | Admitting: Infectious Diseases

## 2023-12-30 ENCOUNTER — Encounter: Payer: Self-pay | Admitting: Nurse Practitioner

## 2023-12-30 ENCOUNTER — Ambulatory Visit (INDEPENDENT_AMBULATORY_CARE_PROVIDER_SITE_OTHER): Payer: Self-pay | Admitting: Nurse Practitioner

## 2023-12-30 ENCOUNTER — Other Ambulatory Visit: Payer: Self-pay | Admitting: Infectious Diseases

## 2023-12-30 VITALS — BP 102/62 | HR 73

## 2023-12-30 DIAGNOSIS — H209 Unspecified iridocyclitis: Secondary | ICD-10-CM

## 2023-12-30 DIAGNOSIS — A1850 Tuberculosis of eye, unspecified: Secondary | ICD-10-CM

## 2023-12-30 DIAGNOSIS — H4041X3 Glaucoma secondary to eye inflammation, right eye, severe stage: Secondary | ICD-10-CM

## 2023-12-30 DIAGNOSIS — J309 Allergic rhinitis, unspecified: Secondary | ICD-10-CM

## 2023-12-30 DIAGNOSIS — E785 Hyperlipidemia, unspecified: Secondary | ICD-10-CM

## 2023-12-30 DIAGNOSIS — E1169 Type 2 diabetes mellitus with other specified complication: Secondary | ICD-10-CM

## 2023-12-30 DIAGNOSIS — Z0000 Encounter for general adult medical examination without abnormal findings: Secondary | ICD-10-CM

## 2023-12-30 LAB — POCT GLYCOSYLATED HEMOGLOBIN (HGB A1C): Hemoglobin A1C: 6.5 % — AB (ref 4.0–5.6)

## 2023-12-30 MED ORDER — METFORMIN HCL ER 500 MG PO TB24: 1000.0000 mg | ORAL_TABLET | Freq: Two times a day (BID) | ORAL | 7 refills | Status: AC

## 2023-12-30 MED ORDER — DAPAGLIFLOZIN PROPANEDIOL 10 MG PO TABS: 10.0000 mg | ORAL_TABLET | Freq: Every day | ORAL | 1 refills | Status: AC

## 2023-12-30 NOTE — Assessment & Plan Note (Signed)
 Allergic rhinitis Symptoms managed with Flonase . - Continue Flonase  nasal spray as needed.

## 2023-12-30 NOTE — Assessment & Plan Note (Signed)
 Annual exam as documented.  Counseling done include healthy lifestyle involving committing to 150 minutes of exercise per week, heart healthy diet low carb diet,  Immunization and cancer screening  needs are specifically addressed at this visit.    - Recommended HPV pneumonia and Tdap vaccines at pharmacy or health department.

## 2023-12-30 NOTE — Assessment & Plan Note (Signed)
 Controlled on atorvastatin  10 mg daily Checking lipid panel

## 2023-12-30 NOTE — Progress Notes (Signed)
 Established Patient Office Visit  Subjective:  Patient ID: Adam Contreras, male    DOB: 07-16-1980  Age: 43 y.o. MRN: [id number]  CC:  Chief Complaint  Patient presents with   Annual Exam    HPI  Discussed the use of AI scribe software for clinical note transcription with the patient, who gave verbal consent to proceed.  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 who presents for an annual physical exam. He is accompanied by his friend.  Interpretation services provided by medical interpreter  He has diabetes, which is well controlled with an A1c of 6.5. He is taking Farxiga  10 mg daily and metformin  1000 mg twice daily and is compliant with his medication regimen.  He is on atorvastatin  10 mg daily for high cholesterol. His last cholesterol check was seven months ago and was noted to be good. He is due for another cholesterol check today.  He has a history of glaucoma and continues to follow up regularly with his eye doctor.  He reports occasional sneezing and a stuffy nose, for which he uses Flonase  nasal spray.  In terms of lifestyle, he avoids white rice, white bread, pasta, and soda. He engages in regular exercise and reports feeling generally well compared to others his age. He does not smoke, drink alcohol, or use drugs. He lives with roommates.  He has not received the pneumonia or Tdap vaccines in the past ten years. He has been in the [place]  for ten years and received some vaccines prior to immigration    Assessment & Plan       Past Medical History:  Diagnosis Date   Blind left eye    Blind left eye 09/06/2014   Dyslipidemia, goal LDL below 70  10/25/2022   Glaucoma    Head injury    Tuberculosis    Uncontrolled type 2 diabetes mellitus with hyperglycemia (HCC) 10/25/2022   Uveitic glaucoma, right, severe stage 09/07/2022    History reviewed. No pertinent surgical history.  Family History  Family history unknown: Yes    Social History   Socioeconomic History   Marital status: Married    Spouse name: Not on file   Number of children: 2   Years of education: Not on file   Highest education level: Not on file  Occupational History   Not on file  Tobacco Use   Smoking status: Never   Smokeless tobacco: Not on file  Substance and Sexual Activity   Alcohol use: No    Alcohol/week: 0.0 standard drinks of alcohol   Drug use: Never   Sexual activity: Not Currently  Other Topics Concern   Not on file  Social History Narrative   Lives with his room mate    Social Drivers of Health   Financial Resource Strain: Not on file  Food Insecurity: Not on file  Transportation Needs: Not on file  Physical Activity: Not on file  Stress: Not on file  Social Connections: Not on file  Intimate Partner Violence: Not on file    Outpatient Medications Prior to Visit  Medication Sig Dispense Refill   atorvastatin  (LIPITOR) 10 MG tablet Take 1 tablet (10 mg total) by mouth daily. 90 tablet 1   brimonidine (ALPHAGAN)  0.2 % ophthalmic solution Place 1 drop into the right eye 2 (two) times daily.     cyclopentolate (CYCLODRYL,CYCLOGYL) 1 % ophthalmic solution 1 drop.     dorzolamide-timolol (COSOPT) 2-0.5 % ophthalmic solution Place 1 drop into the right eye 2 (two) times daily.     fluticasone  (FLONASE ) 50 MCG/ACT nasal spray Place 2 sprays into both nostrils daily. 16 g 6   prednisoLONE acetate (PRED FORTE) 1 % ophthalmic suspension Administer 1 drop to the right eye four (4) times a day.     dapagliflozin  propanediol (FARXIGA ) 10 MG TABS tablet Take 1 tablet (10 mg total) by mouth daily before breakfast. 90 tablet 1   metFORMIN   (GLUCOPHAGE -XR) 500 MG 24 hr tablet Take 2 tablets (1,000 mg total) by mouth 2 (two) times daily with a meal. 120 tablet 7   acetaZOLAMIDE ER (DIAMOX) 500 MG capsule Take by mouth. (Patient not taking: Reported on 12/30/2023)     Blood Glucose Monitoring Suppl DEVI 1 each by Does not apply route in the morning, at noon, and at bedtime. May substitute to any manufacturer covered by patient's insurance. (Patient not taking: Reported on 12/30/2023) 1 each 0   No facility-administered medications prior to visit.    No Known Allergies  ROS Review of Systems  Constitutional:  Negative for appetite change, chills, fatigue and fever.  HENT:  Positive for congestion. Negative for dental problem, drooling and ear discharge.   Eyes:  Positive for visual disturbance. Negative for discharge.  Respiratory:  Negative for cough, shortness of breath and wheezing.   Cardiovascular:  Negative for chest pain, palpitations and leg swelling.  Gastrointestinal:  Negative for abdominal pain, constipation, nausea and vomiting.  Endocrine: Negative for cold intolerance, heat intolerance and polydipsia.  Genitourinary:  Negative for difficulty urinating, dysuria, flank pain and frequency.  Musculoskeletal:  Negative for arthralgias, back pain, joint swelling and myalgias.  Skin:  Negative for color change, pallor, rash and wound.  Allergic/Immunologic: Negative for immunocompromised state.  Neurological:  Negative for dizziness, facial asymmetry, numbness and headaches.  Psychiatric/Behavioral:  Negative for behavioral problems, confusion, self-injury and suicidal ideas.       Objective:    Physical Exam Vitals and nursing note reviewed.  Constitutional:      General: He is not in acute distress.    Appearance: Normal appearance. He is not ill-appearing, toxic-appearing or diaphoretic.  HENT:     Right Ear: Tympanic membrane, ear canal and external ear normal. There is no impacted cerumen.     Left Ear:  Tympanic membrane, ear canal and external ear normal. There is no impacted cerumen.     Nose: Nose normal. No congestion or rhinorrhea.     Mouth/Throat:     Mouth: Mucous membranes are moist.     Pharynx: Oropharynx is clear. No oropharyngeal exudate or posterior oropharyngeal erythema.  Eyes:     Comments:  Blindness left eye   Neck:     Vascular: No carotid bruit.  Cardiovascular:     Rate and Rhythm: Normal rate and regular rhythm.     Pulses: Normal pulses.     Heart sounds: Normal heart sounds. No murmur heard.    No friction rub. No gallop.  Pulmonary:     Effort: Pulmonary effort is normal. No respiratory distress.     Breath sounds: Normal breath sounds. No stridor. No wheezing, rhonchi or rales.  Chest:     Chest wall: No tenderness.  Abdominal:     General:  Bowel sounds are normal. There is no distension.     Palpations: Abdomen is soft. There is no mass.     Tenderness: There is no abdominal tenderness. There is no right CVA tenderness, left CVA tenderness, guarding or rebound.     Hernia: No hernia is present.  Musculoskeletal:        General: No swelling, tenderness, deformity or signs of injury.     Cervical back: Normal range of motion and neck supple. No rigidity or tenderness.     Right lower leg: No edema.  Lymphadenopathy:     Cervical: No cervical adenopathy.  Skin:    General: Skin is warm and dry.     Capillary Refill: Capillary refill takes less than 2 seconds.     Coloration: Skin is not jaundiced.     Findings: No bruising or lesion.  Neurological:     Mental Status: He is alert and oriented to person, place, and time.     Cranial Nerves: No cranial nerve deficit.     Sensory: No sensory deficit.     Motor: No weakness.     Coordination: Coordination normal.     Gait: Gait normal.     Deep Tendon Reflexes: Reflexes normal.  Psychiatric:        Mood and Affect: Mood normal.        Behavior: Behavior normal.        Thought Content: Thought content  normal.        Judgment: Judgment normal.     BP 102/62   Pulse 73   SpO2 100%  Wt Readings from Last 3 Encounters:  08/29/23 152 lb (68.9 kg)  05/23/23 156 lb (70.8 kg)  03/22/23 162 lb (73.5 kg)    Lab Results  Component Value Date   TSH 1.980 09/08/2022   Lab Results  Component Value Date   WBC 4.5 09/08/2022   HGB 14.5 09/08/2022   HCT 44.9 09/08/2022   MCV 84 09/08/2022   PLT 244 09/08/2022   Lab Results  Component Value Date   NA 140 08/29/2023   K 4.3 08/29/2023   CO2 21 08/29/2023   GLUCOSE 85 08/29/2023   BUN 14 08/29/2023   CREATININE 0.65 (L) 08/29/2023   BILITOT 0.4 09/08/2022   ALKPHOS 121 09/08/2022   AST 21 09/08/2022   ALT 34 09/08/2022   PROT 7.5 09/08/2022   ALBUMIN 4.4 09/08/2022   CALCIUM  9.6 08/29/2023   EGFR 121 08/29/2023   Lab Results  Component Value Date   CHOL 114 05/23/2023   Lab Results  Component Value Date   HDL 45 05/23/2023   Lab Results  Component Value Date   LDLCALC 57 05/23/2023   Lab Results  Component Value Date   TRIG 53 05/23/2023   Lab Results  Component Value Date   CHOLHDL 2.5 05/23/2023   Lab Results  Component Value Date   HGBA1C 6.5 (A) 12/30/2023      Assessment & Plan:   Problem List Items Addressed This Visit       Respiratory   Allergic rhinitis   Allergic rhinitis Symptoms managed with Flonase . - Continue Flonase  nasal spray as needed.        Endocrine   Type 2 diabetes mellitus with hyperlipidemia (HCC)   Lab Results  Component Value Date   HGBA1C 6.5 (A) 12/30/2023   Well controlled with A1c at 6.5%. - Continue Dapagliflozin  10 mg daily, Metformin  500 mg twice daily. Patient counseled on low-carb  diet Up-to-date with diabetic eye exam diabetic foot exam completed        Relevant Medications   metFORMIN  (GLUCOPHAGE -XR) 500 MG 24 hr tablet   dapagliflozin  propanediol (FARXIGA ) 10 MG TABS tablet   Other Relevant Orders   POCT glycosylated hemoglobin (Hb A1C)  (Completed)   Microalbumin/Creatinine Ratio, Urine     Other   Annual physical exam - Primary   Annual exam as documented.  Counseling done include healthy lifestyle involving committing to 150 minutes of exercise per week, heart healthy diet low carb diet,  Immunization and cancer screening  needs are specifically addressed at this visit.    - Recommended HPV pneumonia and Tdap vaccines at pharmacy or health department.        Uveitic glaucoma, right, severe stage   Managed by ophthalmologist Encouraged to maintain close follow-up with ophthalmologist      Relevant Medications   cyclopentolate (CYCLODRYL,CYCLOGYL) 1 % ophthalmic solution   Dyslipidemia, goal LDL below 70   Controlled on atorvastatin  10 mg daily Checking lipid panel      Relevant Orders   Lipid panel    Meds ordered this encounter  Medications   metFORMIN  (GLUCOPHAGE -XR) 500 MG 24 hr tablet    Sig: Take 2 tablets (1,000 mg total) by mouth 2 (two) times daily with a meal.    Dispense:  120 tablet    Refill:  7   dapagliflozin  propanediol (FARXIGA ) 10 MG TABS tablet    Sig: Take 1 tablet (10 mg total) by mouth daily before breakfast.    Dispense:  90 tablet    Refill:  1    Follow-up: Return in about 4 months (around 04/28/2024) for DM, HYPERLIPIDEMIA.    Bulmaro Feagans R Tyshana Nishida, FNP

## 2023-12-30 NOTE — Assessment & Plan Note (Signed)
 Lab Results  Component Value Date   HGBA1C 6.5 (A) 12/30/2023   Well controlled with A1c at 6.5%. - Continue Dapagliflozin  10 mg daily, Metformin  500 mg twice daily. Patient counseled on low-carb diet Up-to-date with diabetic eye exam diabetic foot exam completed

## 2023-12-30 NOTE — Assessment & Plan Note (Signed)
 Managed by ophthalmologist Encouraged to maintain close follow-up with ophthalmologist

## 2023-12-30 NOTE — Patient Instructions (Addendum)
 Please consider getting HPV vaccine, pneumonia vaccine and Tdap vaccine at local pharmacy.       It is important that you exercise regularly at least 30 minutes 5 times a week as tolerated  Think about what you will eat, plan ahead. Choose  clean, green, fresh or frozen over canned, processed or packaged foods which are more sugary, salty and fatty. 70 to 75% of food eaten should be vegetables and fruit. Three meals at set times with snacks allowed between meals, but they must be fruit or vegetables. Aim to eat over a 12 hour period , example 7 am to 7 pm, and STOP after  your last meal of the day. Drink water,generally about 64 ounces per day, no other drink is as healthy. Fruit juice is best enjoyed in a healthy way, by EATING the fruit.  Thanks for choosing [facility] we consider it a privelige to serve you.

## 2023-12-31 LAB — LIPID PANEL
Chol/HDL Ratio: 2.6 ratio (ref 0.0–5.0)
Cholesterol, Total: 171 mg/dL (ref 100–199)
HDL: 65 mg/dL (ref >39)
LDL Chol Calc (NIH): 95 mg/dL (ref 0–99)
Triglycerides: 55 mg/dL (ref 0–149)
VLDL Cholesterol Cal: 11 mg/dL (ref 5–40)

## 2023-12-31 LAB — MICROALBUMIN / CREATININE URINE RATIO
Creatinine, Urine: 89.6 mg/dL
Microalb/Creat Ratio: 8 mg/g{creat} (ref 0–29)
Microalbumin, Urine: 7.2 ug/mL

## 2024-01-02 ENCOUNTER — Other Ambulatory Visit: Payer: Self-pay | Admitting: Nurse Practitioner

## 2024-01-02 ENCOUNTER — Ambulatory Visit: Payer: Self-pay | Admitting: Nurse Practitioner

## 2024-01-02 DIAGNOSIS — E1169 Type 2 diabetes mellitus with other specified complication: Secondary | ICD-10-CM

## 2024-01-02 MED ORDER — ATORVASTATIN CALCIUM 20 MG PO TABS: 20.0000 mg | ORAL_TABLET | Freq: Every day | ORAL | 1 refills | Status: AC

## 2024-01-04 NOTE — Telephone Encounter (Signed)
Done Kh 

## 2024-04-27 ENCOUNTER — Ambulatory Visit: Payer: Self-pay | Admitting: Nurse Practitioner
